# Patient Record
Sex: Female | Born: 1990
Health system: Southern US, Community
[De-identification: ages and names within clinical notes are randomized; demographics above are authoritative.]

## PROBLEM LIST (undated history)

## (undated) DIAGNOSIS — R7303 Prediabetes: Secondary | ICD-10-CM

## (undated) DIAGNOSIS — Z8742 Personal history of other diseases of the female genital tract: Secondary | ICD-10-CM

## (undated) DIAGNOSIS — N760 Acute vaginitis: Secondary | ICD-10-CM

## (undated) DIAGNOSIS — B9689 Other specified bacterial agents as the cause of diseases classified elsewhere: Secondary | ICD-10-CM

## (undated) DIAGNOSIS — E669 Obesity, unspecified: Secondary | ICD-10-CM

## (undated) DIAGNOSIS — A64 Unspecified sexually transmitted disease: Secondary | ICD-10-CM

## (undated) HISTORY — DX: Other specified bacterial agents as the cause of diseases classified elsewhere: B96.89

## (undated) HISTORY — DX: Prediabetes: R73.03

## (undated) HISTORY — DX: Personal history of other diseases of the female genital tract: Z87.42

## (undated) HISTORY — DX: Morbid (severe) obesity due to excess calories: E66.01

## (undated) HISTORY — DX: Unspecified sexually transmitted disease: A64

## (undated) HISTORY — DX: Obesity, unspecified: E66.9

## (undated) HISTORY — DX: Other specified bacterial agents as the cause of diseases classified elsewhere: N76.0

## (undated) HISTORY — PX: WISDOM TOOTH EXTRACTION: SHX21

---

## 2012-03-11 DIAGNOSIS — A64 Unspecified sexually transmitted disease: Secondary | ICD-10-CM

## 2012-03-11 HISTORY — DX: Unspecified sexually transmitted disease: A64

## 2013-05-04 HISTORY — PX: COLPOSCOPY: SHX161

## 2015-07-11 ENCOUNTER — Other Ambulatory Visit: Payer: Self-pay | Admitting: Adult Health

## 2015-07-11 DIAGNOSIS — R1032 Left lower quadrant pain: Secondary | ICD-10-CM

## 2015-07-11 DIAGNOSIS — N926 Irregular menstruation, unspecified: Secondary | ICD-10-CM

## 2015-07-14 ENCOUNTER — Ambulatory Visit: Payer: 59

## 2015-07-21 ENCOUNTER — Ambulatory Visit
Admission: RE | Admit: 2015-07-21 | Discharge: 2015-07-21 | Disposition: A | Discharge status: Home / Self Care | Payer: 59 | Source: Ambulatory Visit | Attending: Adult Health | Admitting: Adult Health

## 2015-07-21 DIAGNOSIS — N926 Irregular menstruation, unspecified: Secondary | ICD-10-CM | POA: Diagnosis not present

## 2015-07-21 DIAGNOSIS — R1032 Left lower quadrant pain: Secondary | ICD-10-CM | POA: Diagnosis not present

## 2015-07-21 DIAGNOSIS — N83202 Unspecified ovarian cyst, left side: Secondary | ICD-10-CM | POA: Insufficient documentation

## 2015-07-29 DIAGNOSIS — Z9189 Other specified personal risk factors, not elsewhere classified: Secondary | ICD-10-CM | POA: Insufficient documentation

## 2016-09-26 IMAGING — US US TRANSVAGINAL NON-OB
1 series · 13 of 25 positions shown · non-contrast
Comparison: None

CLINICAL DATA: 25-year-old female with left lower quadrant
abdominal pain and missed menstrual cycles. LMP 01/31/2016, day 8 of
menstrual cycle.

EXAM:
TRANSABDOMINAL AND TRANSVAGINAL ULTRASOUND OF PELVIS
TECHNIQUE: Both transabdominal and transvaginal ultrasound examinations of the
pelvis were performed. Transabdominal technique was performed for
global imaging of the pelvis including uterus, ovaries, adnexal
regions, and pelvic cul-de-sac. It was necessary to proceed with
endovaginal exam following the transabdominal exam to visualize the
endometrium and adnexa.

[Series 1: us transvaginal non-ob · 0.25mm/px · 13 of 92 slices shown]
[im 1/92]
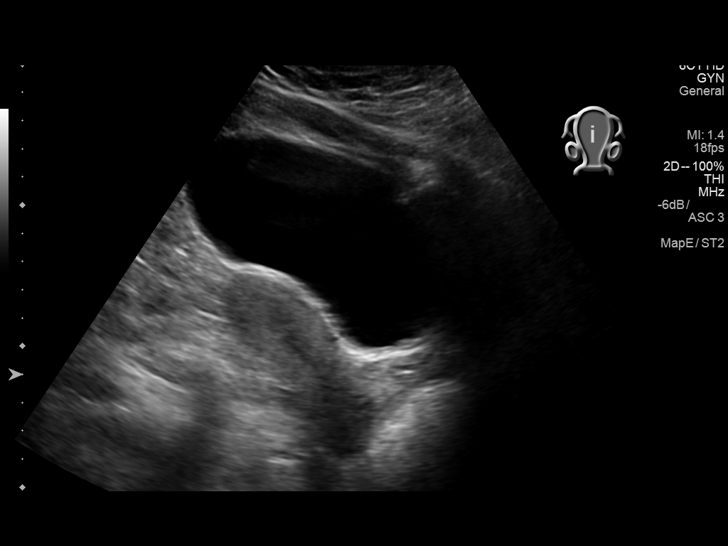
[im 8/92]
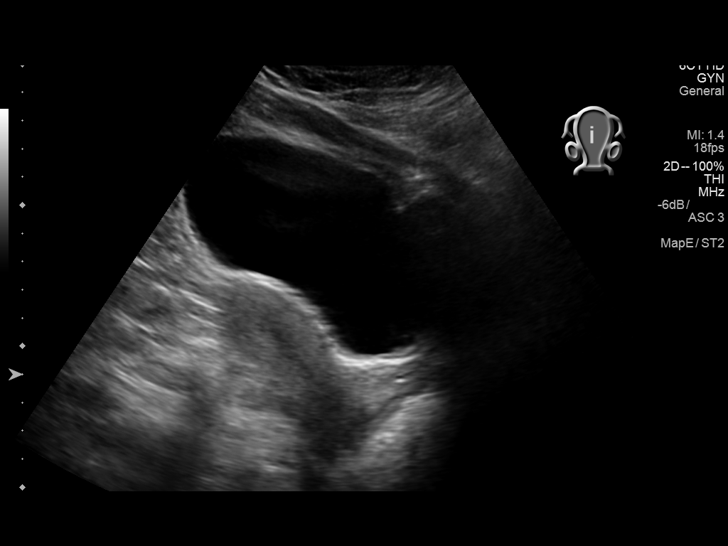
[im 16/92]
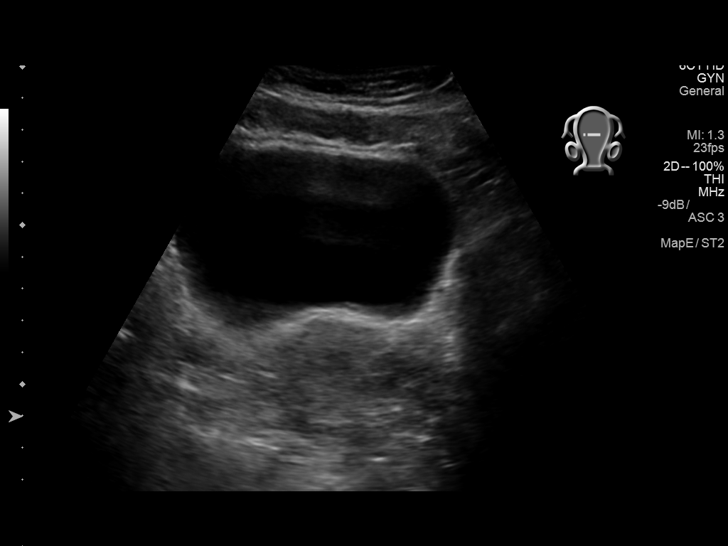
[im 23/92]
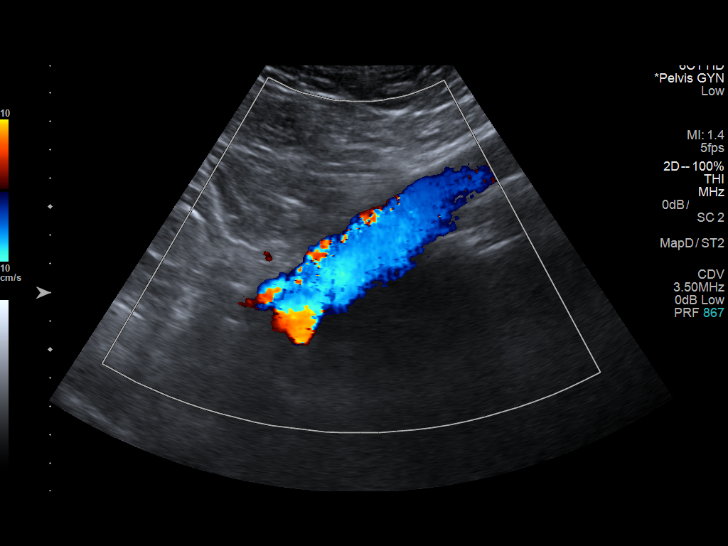
[im 31/92]
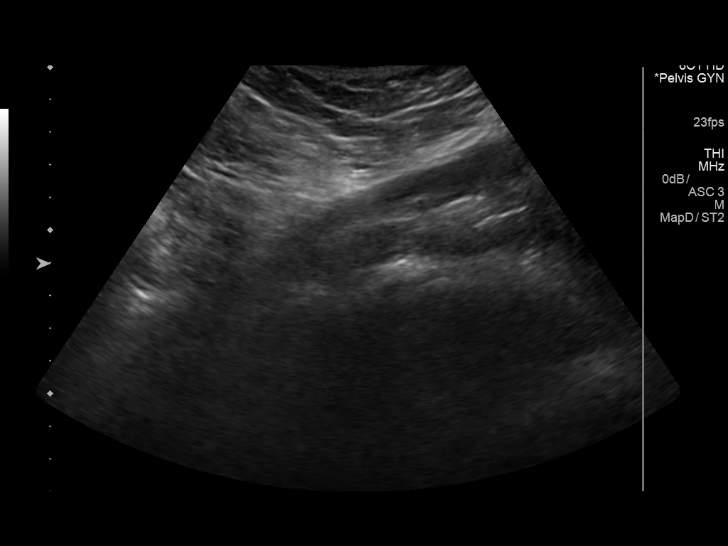
[im 38/92]
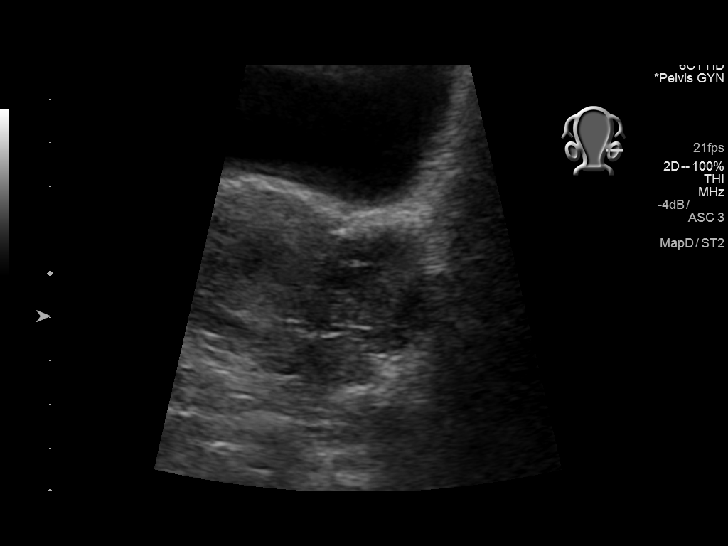
[im 46/92]
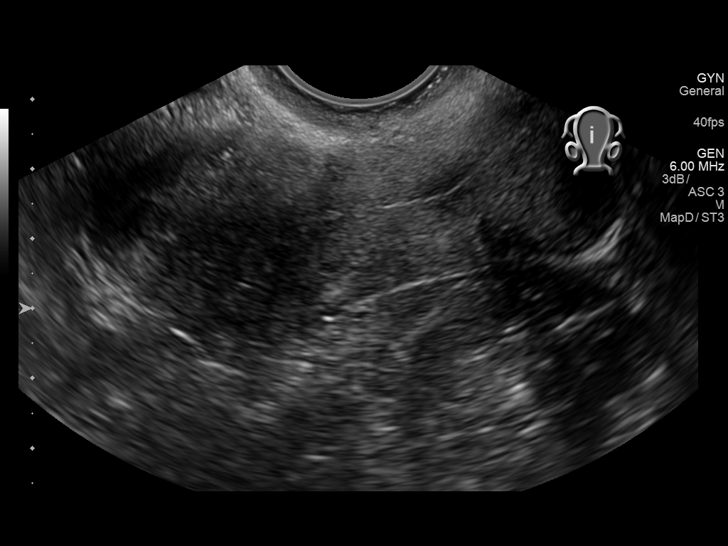
[im 54/92]
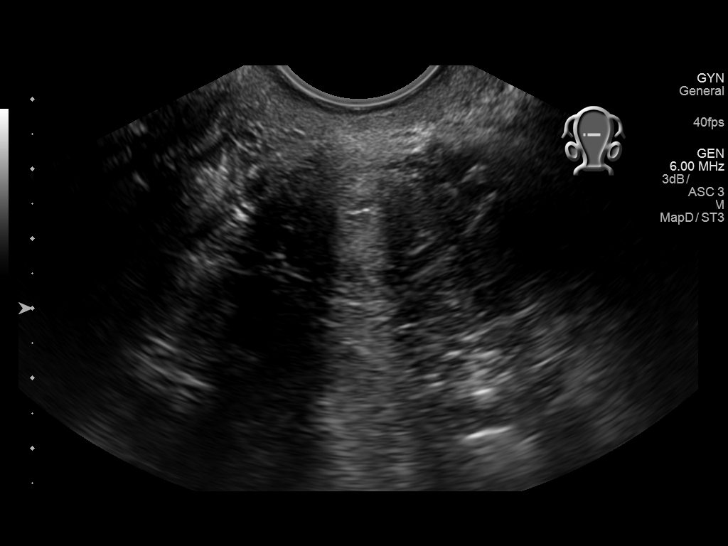
[im 61/92]
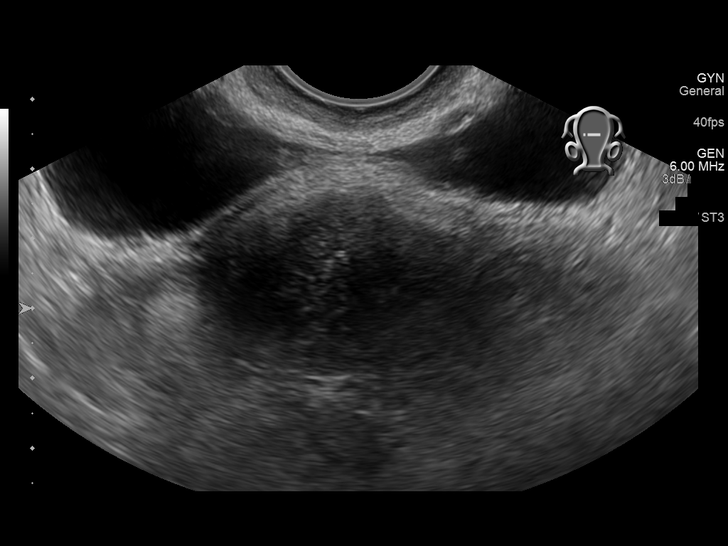
[im 69/92]
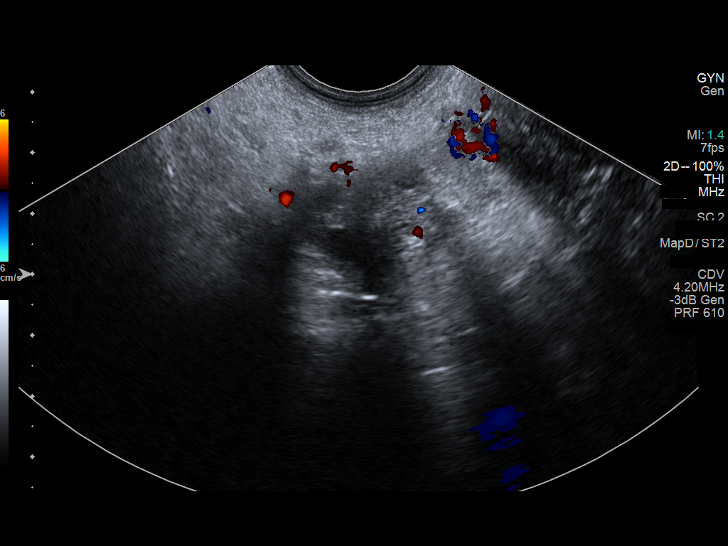
[im 76/92]
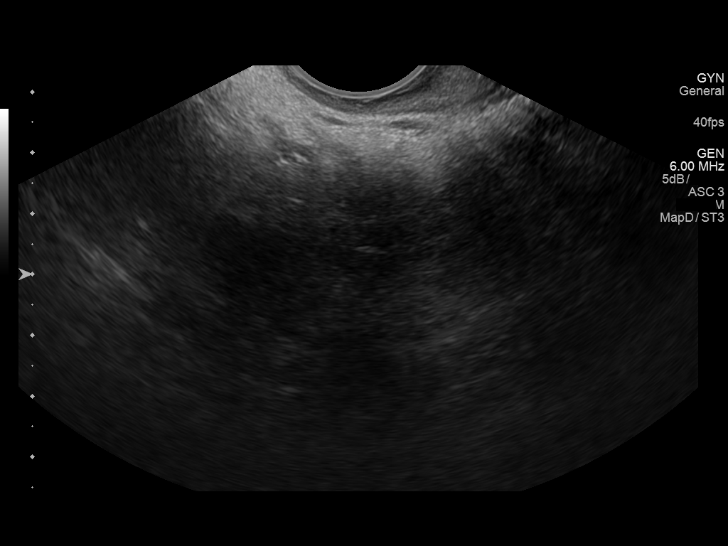
[im 84/92]
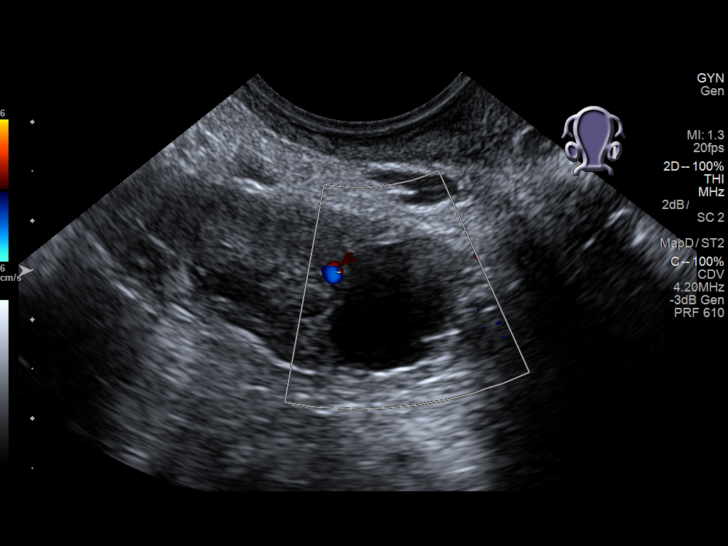
[im 92/92]
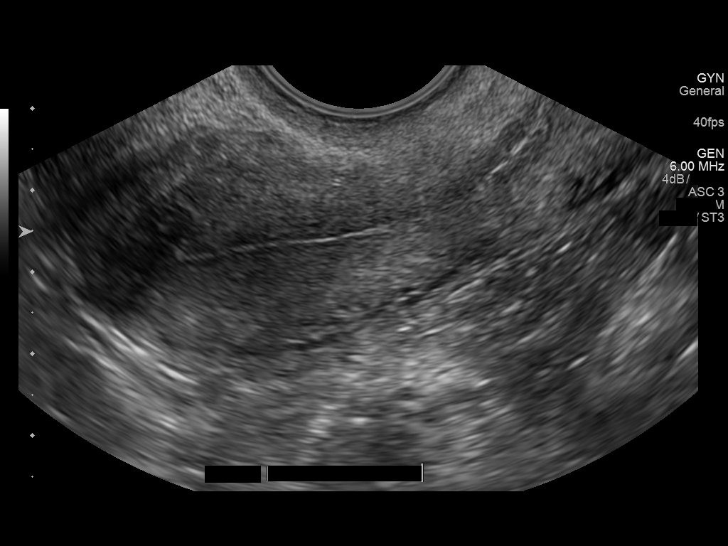

[13 of 25 positions shown; findings below may reference images not displayed]

FINDINGS: Uterus

Measurements: 7.7 x 2.7 x 4.5 cm. The anteverted anteflexed uterus
is normal in size and configuration, with no uterine fibroids or
other myometrial abnormality.

Endometrium

Thickness: 3 mm. No endometrial cavity fluid or focal endometrial
mass.

Right ovary

Measurements: 2.3 x 2.7 x 1.9 cm. Normal appearance/no adnexal mass.

Left ovary

Measurements: 3.3 x 1.8 x 3.0 cm. There is a complex 1.8 x 1.0 x
cm left ovarian cyst with no internal vascularity on color Doppler
and with heterogeneous internal echoes including a thin internal
septation. No additional left adnexal findings.

Other findings

No abnormal free fluid.
IMPRESSION: 1. Complex avascular 1.8 cm left ovarian cyst with heterogeneous
internal echoes, probably a hemorrhagic left ovarian cyst. Recommend
a follow-up transvaginal pelvic ultrasound in 2-3 menstrual cycles
to document resolution given the internal septations.
2. Otherwise normal pelvic sonogram. No uterine fibroids. No
endometrial abnormality.

## 2017-01-31 NOTE — Progress Notes (Signed)
Gynecology Annual Exam  PCP: System, Pcp Not In  Chief Complaint:  Chief Complaint  Patient presents with  . Gynecologic Exam    History of Present Illness:Sara Stevenson is a 26 year old African American/Black female , G 0 P 0 0 0 0 , who presents for her annual exam. She also desires STD screening. Has had a vaginal discharge intermittently since February. Had a check done in February that was negative for vaginitis or STD. Not currently having any itching or bad odor.  She has a hx of abnormal Pap smears. On 03/20/2013 her Pap returned ASCUS with positive HRHPV. A colpo was performed which was negative and a repeat PAP then was NIL with positve HRHPV. Repeat Pap 08/05/13 was ASCUS with positive HRHPV, but on 07/23/2014,  October 2016, and 03/30/2016 she had NIL Paps.  Her menses are usually regular. They occur every  month , they last 3-4 days , are light flow, and are without clots. She did not have a withdrawal bleed in June. She has had no spotting.  She denies dysmenorrhea.  The patient's past medical history is notable for a history of obesity and abnormal Pap smears.  Since her last annual GYN exam dated 03/30/2016 , she had gained then lost almost 20#. She has cut out soft drinks and has been walking 3 days/week. She was successful losing weight in the past with Weight Watchers. She was attending [employer] with some friends from work, but when she got a new job at a different location, she did not continue going to [employer].  She is sexually active. She is currently using birth control pills for contraception.  Her most recent pap smear was obtained 03/30/2016 and was negative.  Mammogram is not applicable.  There is no family history of breast cancer.  There is no family history of ovarian cancer.  The patient does do monthly self breast exams.  The patient does not smoke.  The patient does drink occasionally.  The patient does not use illegal drugs.  The patient does exercise by  walking 3x/week.  The patient does not get adequate calcium in her diet.  She has not had a recent cholesterol screen and is interested in labwork.      The patient denies current symptoms of depression.    Review of Systems: Review of Systems  Constitutional: Negative for chills, fever and weight loss.  HENT: Negative for congestion, sinus pain and sore throat.   Eyes: Negative for blurred vision and pain.  Respiratory: Negative for hemoptysis, shortness of breath and wheezing.   Cardiovascular: Negative for chest pain, palpitations and leg swelling.  Gastrointestinal: Negative for abdominal pain, blood in stool, diarrhea, heartburn, nausea and vomiting.  Genitourinary: Negative for dysuria, frequency, hematuria and urgency.       Positive for vaginal discharge  Musculoskeletal: Negative for back pain, joint pain and myalgias.  Skin: Negative for itching and rash.  Neurological: Negative for dizziness, tingling and headaches.  Endo/Heme/Allergies: Negative for environmental allergies and polydipsia. Does not bruise/bleed easily.       Negative for hirsutism   Psychiatric/Behavioral: Negative for depression. The patient is not nervous/anxious and does not have insomnia.     Past Medical History:  Past Medical History:  Diagnosis Date  . Bacterial vaginitis    recurrrent  . History of abnormal cervical Pap smear   . Morbid obesity (HCC)   . STD (sexually transmitted disease) 03/2012   chlamydia  Past Surgical History:  Past Surgical History:  Procedure Laterality Date  . COLPOSCOPY  05/04/2013   negative  . WISDOM TOOTH EXTRACTION      Family History:  Family History  Problem Relation Age of Onset  . Diabetes Other   . Heart disease Neg Hx   . Hypertension Neg Hx   . Cancer Neg Hx     Social History:  Social History   Social History  . Marital status: Single    Spouse name: N/A  . Number of children: 0  . Years of education: N/A   Occupational History    . Dental Assistant    Social History Main Topics  . Smoking status: Never Smoker  . Smokeless tobacco: Never Used  . Alcohol use Yes     Comment: occasionally  . Drug use: No  . Sexual activity: Yes    Partners: Male    Birth control/ protection: Pill   Other Topics Concern  . Not on file   Social History Narrative  . No narrative on file    Allergies:  No Known Allergies  Medications:  Current Outpatient Prescriptions:  .  LESSINA-28 0.1-20 MG-MCG tablet, , Disp: , Rfl: 0   Physical Exam Vitals: BP 138/68   Pulse 85   Ht 5\' 6"  (1.676 m)   Wt 277 lb (125.6 kg)   LMP 01/07/2017 (Exact Date)   BMI 44.71 kg/m   General: obese BF in NAD HEENT: normocephalic, anicteric Neck: no thyroid enlargement, no palpable nodules, no cervical lymphadenopathy  Pulmonary: No increased work of breathing, CTAB Cardiovascular: RRR, without murmur  Breast: Breast symmetrical, no tenderness, no palpable nodules or masses, no skin or nipple retraction present, no nipple discharge.  No axillary, infraclavicular or supraclavicular lymphadenopathy. Abdomen: Soft, non-tender, obese, non-distended.  Umbilicus without lesions.  No hepatomegaly or masses palpable. No evidence of hernia. Genitourinary:  External: Normal external female genitalia.  Normal urethral meatus, normal Bartholin's and Skene's glands.    Vagina: Normal vaginal mucosa, no evidence of prolapse, moderate white mucoepithelial discharge    Cervix: Grossly normal in appearance, no bleeding, non-tender  Uterus: Anteflexed, normal size, shape, and consistency, mobile, and non-tender  Adnexa: No adnexal masses, non-tender  Rectal: deferred  Lymphatic: no evidence of inguinal lymphadenopathy Extremities: no edema, erythema, or tenderness Neurologic: Grossly intact Psychiatric: mood appropriate, affect full  Results for orders placed or performed in visit on 02/01/17 (from the past 24 hour(s))  POCT Wet Prep [facility] [place])      Status: Normal   Collection Time: 02/03/17  5:59 PM  Result Value Ref Range   Source Wet Prep POC vaginal    WBC, Wet Prep HPF POC     Bacteria Wet Prep HPF POC  Few   BACTERIA WET PREP MORPHOLOGY POC     Clue Cells Wet Prep HPF POC None None   Clue Cells Wet Prep Whiff POC     Yeast Wet Prep HPF POC None    KOH Wet Prep POC     Trichomonas Wet Prep HPF POC Absent Absent      Assessment: 26 y.o. normal gyn exam Obesity  Plan:   1) Breast cancer screening - recommend monthly self breast exam.   2) STI screening done. GC/Chlamydia NAAT, RPR, HIV ordered  3) Cervical cancer screening - Pap was done. ASCCP guidelines and rational discussed.  Patient opts for yearly screening interval  4) Contraception - Refill Aviane x 1 year  5) Routine healthcare maintenance  including cholesterol and diabetes screening ordered today. Discussed weight loss recommendations: reducing calories and increasing exercise: Talked about Belly Fat diet (i.e decreasing CHO in diet), support groups like weight watchers, going to a Bariatric Clinic if desires assistance with medications, etc  6) RTO 1 year and prn  Farrel Conners, CNM

## 2017-02-01 ENCOUNTER — Encounter: Payer: Self-pay | Admitting: Certified Nurse Midwife

## 2017-02-01 ENCOUNTER — Ambulatory Visit (INDEPENDENT_AMBULATORY_CARE_PROVIDER_SITE_OTHER): Payer: 59 | Admitting: Certified Nurse Midwife

## 2017-02-01 VITALS — BP 138/68 | HR 85 | Ht 66.0 in | Wt 277.0 lb

## 2017-02-01 DIAGNOSIS — Z01419 Encounter for gynecological examination (general) (routine) without abnormal findings: Secondary | ICD-10-CM

## 2017-02-01 DIAGNOSIS — Z13220 Encounter for screening for lipoid disorders: Secondary | ICD-10-CM | POA: Diagnosis not present

## 2017-02-01 DIAGNOSIS — N898 Other specified noninflammatory disorders of vagina: Secondary | ICD-10-CM

## 2017-02-01 DIAGNOSIS — Z131 Encounter for screening for diabetes mellitus: Secondary | ICD-10-CM | POA: Diagnosis not present

## 2017-02-01 DIAGNOSIS — Z3041 Encounter for surveillance of contraceptive pills: Secondary | ICD-10-CM

## 2017-02-01 DIAGNOSIS — Z124 Encounter for screening for malignant neoplasm of cervix: Secondary | ICD-10-CM

## 2017-02-01 DIAGNOSIS — Z113 Encounter for screening for infections with a predominantly sexual mode of transmission: Secondary | ICD-10-CM

## 2017-02-02 LAB — LIPID PANEL WITH LDL/HDL RATIO
Cholesterol, Total: 130 mg/dL (ref 100–199)
HDL: 38 mg/dL — ABNORMAL LOW (ref >39)
LDL Calculated: 80 mg/dL (ref 0–99)
LDl/HDL Ratio: 2.1 ratio (ref 0.0–3.2)
Triglycerides: 58 mg/dL (ref 0–149)
VLDL Cholesterol Cal: 12 mg/dL (ref 5–40)

## 2017-02-02 LAB — HGB A1C W/O EAG: HEMOGLOBIN A1C: 5.7 % — AB (ref 4.8–5.6)

## 2017-02-02 LAB — RPR: RPR: NONREACTIVE

## 2017-02-02 LAB — HIV ANTIBODY (ROUTINE TESTING W REFLEX): HIV SCREEN 4TH GENERATION: NONREACTIVE

## 2017-02-03 ENCOUNTER — Encounter: Payer: Self-pay | Admitting: Certified Nurse Midwife

## 2017-02-03 DIAGNOSIS — Z309 Encounter for contraceptive management, unspecified: Secondary | ICD-10-CM | POA: Insufficient documentation

## 2017-02-03 LAB — POCT WET PREP (WET MOUNT): Trichomonas Wet Prep HPF POC: ABSENT

## 2017-02-03 MED ORDER — LESSINA 0.1-20 MG-MCG PO TABS: 1.0000 | ORAL_TABLET | Freq: Every day | ORAL | 11 refills | Status: DC

## 2017-02-05 LAB — PAP IG, CT-NG, RFX HPV ALL
CHLAMYDIA, NUC. ACID AMP: NEGATIVE
GONOCOCCUS BY NUCLEIC ACID AMP: NEGATIVE
PAP Smear Comment: 0

## 2017-02-08 ENCOUNTER — Other Ambulatory Visit: Payer: Self-pay | Admitting: Certified Nurse Midwife

## 2017-02-08 MED ORDER — FLUCONAZOLE 150 MG PO TABS: ORAL_TABLET | ORAL | 0 refills | Status: DC

## 2018-03-01 ENCOUNTER — Other Ambulatory Visit: Payer: Self-pay | Admitting: Certified Nurse Midwife

## 2018-03-13 NOTE — Progress Notes (Signed)
Gynecology Annual Exam  PCP: System, Pcp Not In  Chief Complaint:  Chief Complaint  Patient presents with  . Gynecologic Exam    History of Present Illness:Sara Stevenson is a 27 year old African American/Black female , G 0 P 0 0 0 0 , who presents for her annual exam. She also desires STD screening. She has a hx of abnormal Pap smears. On 03/20/2013 her Pap returned ASCUS with positive HRHPV. A colpo was performed which was negative and a repeat PAP then was NIL with positve HRHPV. Repeat Pap 08/05/13 was ASCUS with positive HRHPV, but on 07/23/2014,  October 2016,  03/30/2016, and 02/01/17 she had NIL Paps.  Her menses are irregular and she will not have a withdrawl bleed some months. They occur every 1-3 months , they last 4 days , are moderate flow with 2 heavier days requiring pad changes 5-6 times/day. She has had no spotting.  She denies dysmenorrhea.  The patient's past medical history is notable for a history of obesity and abnormal Pap smears.  Since her last annual GYN exam dated 02/01/2017 , she has gained #14. Her current BMI is 47.01 kg/m2. She has also recently been treated for uticaria on her shoulders with prednisone, topical steroid and possibly hydroxyzine. She is sexually active. She is currently using birth control pills for contraception.  Her most recent pap smear was obtained 02/01/2017 and was negative.  Mammogram is not applicable.  There is no family history of breast cancer.  There is no family history of ovarian cancer.  The patient does do monthly self breast exams.  The patient does not smoke.  The patient does drink on weekends (2/weekend).  The patient does not use illegal drugs.  The patient does exercise occasionally by playing volleyball.  The patient does not get adequate calcium in her diet.  She has had a recent cholesterol screen in 2018 and it was normal. A hemoglobin A1C was 5.7%.    The patient denies current symptoms of depression.     Review of Systems: Review of Systems  Constitutional: Negative for chills, fever and weight loss.       Possible weight gain  HENT: Negative for congestion, sinus pain and sore throat.   Eyes: Negative for blurred vision and pain.  Respiratory: Negative for hemoptysis, shortness of breath and wheezing.   Cardiovascular: Negative for chest pain, palpitations and leg swelling.  Gastrointestinal: Negative for abdominal pain, blood in stool, diarrhea, heartburn, nausea and vomiting.  Genitourinary: Negative for dysuria, frequency, hematuria and urgency.  Musculoskeletal: Negative for back pain, joint pain and myalgias.  Skin: Positive for itching and rash.  Neurological: Negative for dizziness, tingling and headaches.  Endo/Heme/Allergies: Negative for environmental allergies and polydipsia. Does not bruise/bleed easily.       Negative for hirsutism   Psychiatric/Behavioral: Negative for depression. The patient is not nervous/anxious and does not have insomnia.     Past Medical History:  Past Medical History:  Diagnosis Date  . Bacterial vaginitis    recurrrent  . History of abnormal cervical Pap smear 2010; 03/20/13   POS HRHPV  . Morbid obesity (HCC)   . STD (sexually transmitted disease) 03/2012   chlamydia    Past Surgical History:  Past Surgical History:  Procedure Laterality Date  . COLPOSCOPY  05/04/2013   negative  . WISDOM TOOTH EXTRACTION      Family History:  Family History  Problem Relation Age of Onset  . Diabetes  Other   . Heart disease Neg Hx   . Hypertension Neg Hx   . Cancer Neg Hx     Social History:  Social History   Socioeconomic History  . Marital status: Single    Spouse name: Not on file  . Number of children: 0  . Years of education: 19  . Highest education level: Not on file  Occupational History  . Occupation: Sales executive  . Occupation: CUSTOMER SERVICE    Comment: [employer]  Social Needs  . Financial resource strain: Not on  file  . Food insecurity:    Worry: Not on file    Inability: Not on file  . Transportation needs:    Medical: Not on file    Non-medical: Not on file  Tobacco Use  . Smoking status: Never Smoker  . Smokeless tobacco: Never Used  Substance and Sexual Activity  . Alcohol use: Yes    Comment: occasionally  . Drug use: No  . Sexual activity: Yes    Partners: Male    Birth control/protection: Pill  Lifestyle  . Physical activity:    Days per week: Not on file    Minutes per session: Not on file  . Stress: Not on file  Relationships  . Social connections:    Talks on phone: Not on file    Gets together: Not on file    Attends religious service: Not on file    Active member of club or organization: Not on file    Attends meetings of clubs or organizations: Not on file    Relationship status: Not on file  . Intimate partner violence:    Fear of current or ex partner: Not on file    Emotionally abused: Not on file    Physically abused: Not on file    Forced sexual activity: Not on file  Other Topics Concern  . Not on file  Social History Narrative  . Not on file    Allergies:  No Known Allergies  Medications:  Current Outpatient Medications:  .  LESSINA-28 0.1-20 MG-MCG tablet, TAKE 1 TABLET BY MOUTH ONCE DAILY, Disp: 28 tablet, Rfl: 0   Physical Exam Vitals: BP 120/78   Pulse 78   Ht 5\' 6"  (1.676 m)   Wt 291 lb 4 oz (132.1 kg)   LMP 02/17/2018   BMI 47.01 kg/m   General: obese BF in NAD HEENT: normocephalic, anicteric Neck: no thyroid enlargement, no palpable nodules, no cervical lymphadenopathy  Pulmonary: No increased work of breathing, CTAB Cardiovascular: RRR, without murmur  Breast: Breast symmetrical, no tenderness, no palpable nodules or masses, no skin or nipple retraction present, no nipple discharge.  No axillary, infraclavicular or supraclavicular lymphadenopathy. Abdomen: Soft, non-tender, obese, non-distended.  Umbilicus without lesions.  No  hepatomegaly or masses palpable. No evidence of hernia. Genitourinary:  External: Normal external female genitalia.  Normal urethral meatus, normal Bartholin's and Skene's glands.    Vagina: Normal vaginal mucosa, no evidence of prolapse, moderate white mucoepithelial discharge    Cervix: Grossly normal in appearance, no bleeding, non-tender  Uterus: Anteflexed, normal size, shape, and consistency, mobile, and non-tender  Adnexa: No adnexal masses, non-tender  Rectal: deferred  Lymphatic: no evidence of inguinal lymphadenopathy Extremities: no edema, erythema, or tenderness Neurologic: Grossly intact Psychiatric: mood appropriate, affect full   Assessment: 27 y.o. normal gyn exam Obesity Elevated hemoglobin A1C Plan:   1) Breast cancer screening - recommend monthly self breast exam.   2) STI screening done.  GC/Chlamydia ,  HIV ordered  3) Cervical cancer screening - Pap was done. ASCCP guidelines and rational discussed.  Patient opts for yearly screening interval  4) Contraception - Refill Aviane x 1 year  5) Hemoglobin A1C repeated today.  6) RTO 1 year and prn  Farrel Conners, CNM

## 2018-03-14 ENCOUNTER — Encounter: Payer: Self-pay | Admitting: Certified Nurse Midwife

## 2018-03-14 ENCOUNTER — Ambulatory Visit (INDEPENDENT_AMBULATORY_CARE_PROVIDER_SITE_OTHER): Payer: BLUE CROSS/BLUE SHIELD | Admitting: Certified Nurse Midwife

## 2018-03-14 ENCOUNTER — Other Ambulatory Visit (HOSPITAL_COMMUNITY)
Admission: RE | Admit: 2018-03-14 | Discharge: 2018-03-14 | Disposition: A | Discharge status: Home / Self Care | Payer: BLUE CROSS/BLUE SHIELD | Source: Ambulatory Visit | Attending: Certified Nurse Midwife | Admitting: Certified Nurse Midwife

## 2018-03-14 VITALS — BP 120/78 | HR 78 | Ht 66.0 in | Wt 291.2 lb

## 2018-03-14 DIAGNOSIS — Z124 Encounter for screening for malignant neoplasm of cervix: Secondary | ICD-10-CM | POA: Insufficient documentation

## 2018-03-14 DIAGNOSIS — Z113 Encounter for screening for infections with a predominantly sexual mode of transmission: Secondary | ICD-10-CM | POA: Insufficient documentation

## 2018-03-14 DIAGNOSIS — R7309 Other abnormal glucose: Secondary | ICD-10-CM

## 2018-03-14 DIAGNOSIS — Z01419 Encounter for gynecological examination (general) (routine) without abnormal findings: Secondary | ICD-10-CM | POA: Diagnosis not present

## 2018-03-15 LAB — HEMOGLOBIN A1C
ESTIMATED AVERAGE GLUCOSE: 123 mg/dL
HEMOGLOBIN A1C: 5.9 % — AB (ref 4.8–5.6)

## 2018-03-15 LAB — HIV ANTIBODY (ROUTINE TESTING W REFLEX): HIV Screen 4th Generation wRfx: NONREACTIVE

## 2018-03-16 MED ORDER — LESSINA 0.1-20 MG-MCG PO TABS: 1.0000 | ORAL_TABLET | Freq: Every day | ORAL | 3 refills | Status: DC

## 2018-03-17 LAB — CYTOLOGY - PAP
CHLAMYDIA, DNA PROBE: NEGATIVE
DIAGNOSIS: NEGATIVE
Neisseria Gonorrhea: NEGATIVE

## 2018-03-29 ENCOUNTER — Other Ambulatory Visit: Payer: Self-pay | Admitting: Certified Nurse Midwife

## 2018-08-08 DIAGNOSIS — N760 Acute vaginitis: Secondary | ICD-10-CM | POA: Diagnosis not present

## 2018-08-18 DIAGNOSIS — H5213 Myopia, bilateral: Secondary | ICD-10-CM | POA: Diagnosis not present

## 2018-10-29 DIAGNOSIS — Z1159 Encounter for screening for other viral diseases: Secondary | ICD-10-CM | POA: Diagnosis not present

## 2018-10-29 DIAGNOSIS — L509 Urticaria, unspecified: Secondary | ICD-10-CM | POA: Diagnosis not present

## 2018-11-27 DIAGNOSIS — Z8742 Personal history of other diseases of the female genital tract: Secondary | ICD-10-CM | POA: Diagnosis not present

## 2018-11-27 DIAGNOSIS — N926 Irregular menstruation, unspecified: Secondary | ICD-10-CM | POA: Diagnosis not present

## 2018-11-27 DIAGNOSIS — R1032 Left lower quadrant pain: Secondary | ICD-10-CM | POA: Diagnosis not present

## 2018-11-27 DIAGNOSIS — R1031 Right lower quadrant pain: Secondary | ICD-10-CM | POA: Diagnosis not present

## 2018-12-08 ENCOUNTER — Other Ambulatory Visit: Payer: Self-pay

## 2018-12-08 ENCOUNTER — Ambulatory Visit (INDEPENDENT_AMBULATORY_CARE_PROVIDER_SITE_OTHER): Payer: BC Managed Care – PPO | Admitting: Obstetrics and Gynecology

## 2018-12-08 ENCOUNTER — Encounter: Payer: Self-pay | Admitting: Obstetrics and Gynecology

## 2018-12-08 VITALS — BP 138/80 | HR 83 | Ht 66.0 in | Wt 299.0 lb

## 2018-12-08 DIAGNOSIS — R350 Frequency of micturition: Secondary | ICD-10-CM | POA: Diagnosis not present

## 2018-12-08 DIAGNOSIS — R102 Pelvic and perineal pain: Secondary | ICD-10-CM | POA: Diagnosis not present

## 2018-12-08 DIAGNOSIS — Z113 Encounter for screening for infections with a predominantly sexual mode of transmission: Secondary | ICD-10-CM | POA: Diagnosis not present

## 2018-12-08 DIAGNOSIS — N926 Irregular menstruation, unspecified: Secondary | ICD-10-CM

## 2018-12-08 DIAGNOSIS — N939 Abnormal uterine and vaginal bleeding, unspecified: Secondary | ICD-10-CM

## 2018-12-08 LAB — POCT URINALYSIS DIPSTICK
Bilirubin, UA: NEGATIVE
Blood, UA: NEGATIVE
Glucose, UA: NEGATIVE
Nitrite, UA: NEGATIVE
Protein, UA: POSITIVE — AB
Spec Grav, UA: 1.025 (ref 1.010–1.025)
Urobilinogen, UA: 0.2 E.U./dL
pH, UA: 5 (ref 5.0–8.0)

## 2018-12-08 NOTE — Progress Notes (Signed)
Patient ID: Sara Stevenson Rawson, female   DOB: Nov 08, 1990, 28 y.o.   MRN: [id number]  Reason for Consult: Pelvic Pain (Heavy periods, 2 weeks of pelvic pain that felt constant)   Referred by No ref. provider found  Subjective:     HPI:  Sara Stevenson Klonowski is a 28 y.o. female. She is having issues with heavy bleeding during her menstrual cycle and constant pelvic pain  Past Medical History:  Diagnosis Date  . Bacterial vaginitis    recurrrent  . History of abnormal cervical Pap smear 2010; 03/20/13   POS HRHPV  . Morbid obesity (HCC)   . STD (sexually transmitted disease) 03/2012   chlamydia   Family History  Problem Relation Age of Onset  . Diabetes Other   . Heart disease Neg Hx   . Hypertension Neg Hx   . Cancer Neg Hx    Past Surgical History:  Procedure Laterality Date  . COLPOSCOPY  05/04/2013   negative  . WISDOM TOOTH EXTRACTION      Short Social History:  Social History   Tobacco Use  . Smoking status: Never Smoker  . Smokeless tobacco: Never Used  Substance Use Topics  . Alcohol use: Yes    Comment: occasionally    No Known Allergies  Current Outpatient Medications  Medication Sig Dispense Refill  . cetirizine (ZYRTEC) 10 MG tablet Take by mouth.    . LESSINA-28 0.1-20 MG-MCG tablet Take 1 tablet by mouth daily. 84 tablet 3   No current facility-administered medications for this visit.     Review of Systems  Constitutional: Negative for chills, fatigue, fever and unexpected weight change.  HENT: Negative for trouble swallowing.  Eyes: Negative for loss of vision.  Respiratory: Negative for cough, shortness of breath and wheezing.  Cardiovascular: Negative for chest pain, leg swelling, palpitations and syncope.  GI: Gastrointestinal negative. Negative for abdominal pain, blood in stool, diarrhea, nausea and vomiting.  GU: Positive for frequency. Negative for difficulty urinating, dysuria and hematuria.  Musculoskeletal: Negative for back pain, leg pain  and joint pain.  Skin: Negative for rash.  Neurological: Negative for dizziness, headaches, light-headedness, numbness and seizures.  Psychiatric: Negative for behavioral problem, confusion, depressed mood and sleep disturbance.        Objective:  Objective   Vitals:   12/08/18 0811  BP: 138/80  Pulse: 83  Weight: 299 lb (135.6 kg)  Height: 5\' 6"  (1.676 m)   Body mass index is 48.26 kg/m.  Physical Exam Vitals signs and nursing note reviewed.  Constitutional:      Appearance: She is well-developed.  HENT:     Head: Normocephalic and atraumatic.  Eyes:     Pupils: Pupils are equal, round, and reactive to light.  Cardiovascular:     Rate and Rhythm: Normal rate and regular rhythm.  Pulmonary:     Effort: Pulmonary effort is normal. No respiratory distress.  Genitourinary:    Comments: Normal cervix. Scant normal discharge. No vulvar or vaginal lesions. No CMT. No uterine or adnexal tenderness. No ovarian masses appreciated. Exam limited by body habitus but otherwise normal uterine shape and size.  Skin:    General: Skin is warm and dry.  Neurological:     Mental Status: She is alert and oriented to person, place, and time.  Psychiatric:        Behavior: Behavior normal.        Thought Content: Thought content normal.        Judgment: Judgment normal.  10/29/2018 TSH 0.93 CBC hgb 11.6      Assessment/Plan:     Follow up for [place]- possible fibroids with heavy bleeding STD testing at patient request Urine culture sent   Gordon, [place] 12/08/2018 8:22 AM

## 2018-12-10 LAB — URINE CULTURE: Organism ID, Bacteria: NO GROWTH

## 2018-12-11 ENCOUNTER — Telehealth: Payer: Self-pay

## 2018-12-11 LAB — NUSWAB VAGINITIS PLUS (VG+)
Candida albicans, NAA: NEGATIVE
Candida glabrata, NAA: NEGATIVE
Chlamydia trachomatis, NAA: NEGATIVE
Neisseria gonorrhoeae, NAA: NEGATIVE
Trich vag by NAA: NEGATIVE

## 2018-12-11 NOTE — Telephone Encounter (Signed)
Pt calling for results from Monday.  [phone]  Left detailed msg; apologized no one has given her the results;  Adv all negative.

## 2018-12-11 NOTE — Progress Notes (Signed)
WNL, released to mychart.

## 2018-12-19 ENCOUNTER — Other Ambulatory Visit: Payer: Self-pay

## 2018-12-19 ENCOUNTER — Ambulatory Visit (INDEPENDENT_AMBULATORY_CARE_PROVIDER_SITE_OTHER): Payer: BC Managed Care – PPO

## 2018-12-19 ENCOUNTER — Ambulatory Visit (INDEPENDENT_AMBULATORY_CARE_PROVIDER_SITE_OTHER): Payer: BC Managed Care – PPO | Admitting: Obstetrics and Gynecology

## 2018-12-19 ENCOUNTER — Encounter: Payer: Self-pay | Admitting: Obstetrics and Gynecology

## 2018-12-19 VITALS — Ht 66.0 in | Wt 302.0 lb

## 2018-12-19 DIAGNOSIS — Z3009 Encounter for other general counseling and advice on contraception: Secondary | ICD-10-CM | POA: Diagnosis not present

## 2018-12-19 DIAGNOSIS — R102 Pelvic and perineal pain unspecified side: Secondary | ICD-10-CM

## 2018-12-19 DIAGNOSIS — N926 Irregular menstruation, unspecified: Secondary | ICD-10-CM

## 2018-12-19 DIAGNOSIS — N911 Secondary amenorrhea: Secondary | ICD-10-CM

## 2018-12-19 DIAGNOSIS — N939 Abnormal uterine and vaginal bleeding, unspecified: Secondary | ICD-10-CM

## 2018-12-19 NOTE — Progress Notes (Signed)
Virtual Visit via Telephone Note  I connected with Sara Stevenson on 12/22/18 at  2:10 PM EDT by telephone and verified that I am speaking with the correct person using two identifiers.   I discussed the limitations, risks, security and privacy concerns of performing an evaluation and management service by telephone and the availability of in person appointments. I also discussed with the patient that there may be a patient responsible charge related to this service. The patient expressed understanding and agreed to proceed.  The patient was at home I spoke with the patient from my office The names of people involved in this encounter were: [name] and [place].   History of Present Illness: Patient presents today for [place] follow up. She is on an OCP but having very light bleeding.    Observations/Objective:   Physical Exam could not be performed. Because of the COVID-19 outbreak this visit was performed over the phone and not in person.   Assessment and Plan: 28 yo with light vaginal bleeding on her period.  Thin endometrium on [place] today. Will try a higher estrogen OCP and see if this results in a normal period bleeding.   Follow Up Instructions: Follow up in 1-2 months. Telephone visit okay   I discussed the assessment and treatment plan with the patient. The patient was provided an opportunity to ask questions and all were answered. The patient agreed with the plan and demonstrated an understanding of the instructions.   The patient was advised to call back or seek an in-person evaluation if the symptoms worsen or if the condition fails to improve as anticipated.  I provided 8 minutes of non-face-to-face time during this encounter.  Adrian Prows MD [facility], [facility] 12/22/2018 3:50 PM

## 2018-12-22 ENCOUNTER — Telehealth: Payer: Self-pay

## 2018-12-22 ENCOUNTER — Encounter: Payer: Self-pay | Admitting: Obstetrics and Gynecology

## 2018-12-22 MED ORDER — DESOGESTREL-ETHINYL ESTRADIOL 0.15-30 MG-MCG PO TABS: 1.0000 | ORAL_TABLET | Freq: Every day | ORAL | 11 refills | Status: DC

## 2018-12-22 NOTE — Telephone Encounter (Signed)
Pt calling; [facility] was supposed to send in rx for new bc; hasn't been sent to pharm; supposed to start a new pack today.  [phone]

## 2018-12-22 NOTE — Telephone Encounter (Signed)
Called [facility] and she is sending in the Rx right now. Thank you

## 2018-12-23 ENCOUNTER — Telehealth: Payer: Self-pay | Admitting: Obstetrics and Gynecology

## 2018-12-23 NOTE — Telephone Encounter (Signed)
-----   Message from Homero Fellers, MD sent at 12/22/2018  4:11 PM EDT ----- Could you please call and schedule this patient for a follow up telephone visit in 4-8 weeks? Thank you,  Dr. Gilman Schmidt

## 2018-12-23 NOTE — Telephone Encounter (Signed)
Called and left detailed message for patient to call back and confirm schedule telephone appointment for Tuesday, 01/20/19 at 8:50 with Dr. Gilman Schmidt .

## 2018-12-24 NOTE — Telephone Encounter (Signed)
Called and left voice mail for patient to call back to be schedule °

## 2018-12-26 ENCOUNTER — Other Ambulatory Visit: Payer: BC Managed Care – PPO

## 2018-12-26 ENCOUNTER — Other Ambulatory Visit: Payer: Self-pay

## 2018-12-26 ENCOUNTER — Other Ambulatory Visit: Payer: Self-pay | Admitting: Obstetrics and Gynecology

## 2018-12-26 DIAGNOSIS — N911 Secondary amenorrhea: Secondary | ICD-10-CM

## 2018-12-26 NOTE — Telephone Encounter (Signed)
Called and left voice mail for patient to call back to be schedule °

## 2018-12-30 LAB — LUTEINIZING HORMONE: LH: 12.8 m[IU]/mL

## 2018-12-30 LAB — PROLACTIN: Prolactin: 17.6 ng/mL (ref 4.8–23.3)

## 2018-12-30 LAB — TESTOSTERONE,FREE AND TOTAL
Testosterone, Free: 1.3 pg/mL (ref 0.0–4.2)
Testosterone: 17 ng/dL (ref 8–48)

## 2018-12-30 LAB — FOLLICLE STIMULATING HORMONE: FSH: 6.2 m[IU]/mL

## 2018-12-30 LAB — ESTRADIOL: Estradiol: 98.2 pg/mL

## 2018-12-30 NOTE — Progress Notes (Signed)
WNL, LH: FSH ratio elevated, released to mychart with note.

## 2019-01-20 ENCOUNTER — Ambulatory Visit: Payer: BC Managed Care – PPO | Admitting: Obstetrics and Gynecology

## 2019-02-20 ENCOUNTER — Other Ambulatory Visit: Payer: Self-pay | Admitting: Certified Nurse Midwife

## 2019-03-16 ENCOUNTER — Ambulatory Visit: Payer: BC Managed Care – PPO | Admitting: Obstetrics and Gynecology

## 2019-03-27 ENCOUNTER — Other Ambulatory Visit: Payer: Self-pay

## 2019-03-27 ENCOUNTER — Encounter: Payer: Self-pay | Admitting: Obstetrics and Gynecology

## 2019-03-27 ENCOUNTER — Ambulatory Visit (INDEPENDENT_AMBULATORY_CARE_PROVIDER_SITE_OTHER): Payer: BC Managed Care – PPO | Admitting: Obstetrics and Gynecology

## 2019-03-27 VITALS — BP 130/76 | HR 75 | Ht 66.0 in | Wt 285.0 lb

## 2019-03-27 DIAGNOSIS — N939 Abnormal uterine and vaginal bleeding, unspecified: Secondary | ICD-10-CM | POA: Diagnosis not present

## 2019-03-27 DIAGNOSIS — Z113 Encounter for screening for infections with a predominantly sexual mode of transmission: Secondary | ICD-10-CM

## 2019-03-27 DIAGNOSIS — Z131 Encounter for screening for diabetes mellitus: Secondary | ICD-10-CM

## 2019-03-27 DIAGNOSIS — Z01419 Encounter for gynecological examination (general) (routine) without abnormal findings: Secondary | ICD-10-CM | POA: Diagnosis not present

## 2019-03-27 DIAGNOSIS — Z13220 Encounter for screening for lipoid disorders: Secondary | ICD-10-CM

## 2019-03-27 DIAGNOSIS — Z1239 Encounter for other screening for malignant neoplasm of breast: Secondary | ICD-10-CM

## 2019-03-27 DIAGNOSIS — Z1289 Encounter for screening for malignant neoplasm of other sites: Secondary | ICD-10-CM

## 2019-03-27 DIAGNOSIS — Z6841 Body Mass Index (BMI) 40.0 and over, adult: Secondary | ICD-10-CM

## 2019-03-27 DIAGNOSIS — Z0000 Encounter for general adult medical examination without abnormal findings: Secondary | ICD-10-CM | POA: Diagnosis not present

## 2019-03-27 DIAGNOSIS — Z1329 Encounter for screening for other suspected endocrine disorder: Secondary | ICD-10-CM

## 2019-03-27 NOTE — Patient Instructions (Signed)
Institute of Medicine Recommended Dietary Allowances for Calcium and Vitamin D  Age (yr) Calcium Recommended Dietary Allowance (mg/day) Vitamin D Recommended Dietary Allowance (international units/day)  9-18 1,300 600  19-50 1,000 600  51-70 1,200 600  71 and older 1,200 800  Data from Institute of Medicine. Dietary reference intakes: calcium, vitamin D. [place], [place]: National Academies Press; 2011.    

## 2019-03-27 NOTE — Progress Notes (Signed)
Gynecology Annual Exam   PCP: System, Pcp Not In  Chief Complaint:  Chief Complaint  Patient presents with  . Gynecologic Exam    Birth control has made periods regular    History of Present Illness: Patient is a 28 y.o. G0P0000 presents for annual exam. The patient has no complaints today.   LMP: Patient's last menstrual period was 03/14/2019 (exact date). Average Interval: regular, 28 days Duration of flow: 3 days Heavy Menses: no Clots: no Intermenstrual Bleeding: no Postcoital Bleeding: no Dysmenorrhea: no  The patient is sexually active. She currently uses OCP (estrogen/progesterone) for contraception. She denies dyspareunia.  The patient does perform self breast exams.  There is no notable family history of breast or ovarian cancer in her family.  The patient wears seatbelts: yes.   The patient has regular exercise: walking.    The patient denies current symptoms of depression.    Review of Systems: Review of Systems  Constitutional: Negative for chills, fever, malaise/fatigue and weight loss.  HENT: Negative for congestion, hearing loss and sinus pain.   Eyes: Negative for blurred vision and double vision.  Respiratory: Negative for cough, sputum production, shortness of breath and wheezing.   Cardiovascular: Negative for chest pain, palpitations, orthopnea and leg swelling.  Gastrointestinal: Negative for abdominal pain, constipation, diarrhea, nausea and vomiting.  Genitourinary: Negative for dysuria, flank pain, frequency, hematuria and urgency.  Musculoskeletal: Negative for back pain, falls and joint pain.  Skin: Negative for itching and rash.  Neurological: Negative for dizziness and headaches.  Psychiatric/Behavioral: Negative for depression, substance abuse and suicidal ideas. The patient is not nervous/anxious.     Past Medical History:  Past Medical History:  Diagnosis Date  . Bacterial vaginitis    recurrrent  . History of abnormal cervical Pap  smear 2010; 03/20/13   POS HRHPV  . Morbid obesity ([place])   . STD (sexually transmitted disease) 03/2012   chlamydia    Past Surgical History:  Past Surgical History:  Procedure Laterality Date  . COLPOSCOPY  05/04/2013   negative  . WISDOM TOOTH EXTRACTION      Gynecologic History:  Patient's last menstrual period was 03/14/2019 (exact date). Contraception: OCP (estrogen/progesterone) Last Pap: Results were: NIL 2019   Obstetric History: G0P0000  Family History:  Family History  Problem Relation Age of Onset  . Diabetes Other   . Heart disease Neg Hx   . Hypertension Neg Hx   . Cancer Neg Hx     Social History:  Social History   Socioeconomic History  . Marital status: Single    Spouse name: Not on file  . Number of children: 0  . Years of education: 46  . Highest education level: Not on file  Occupational History  . Occupation: Art therapist  . Occupation: CUSTOMER SERVICE    Comment: [employer]  Social Needs  . Financial resource strain: Not on file  . Food insecurity    Worry: Not on file    Inability: Not on file  . Transportation needs    Medical: Not on file    Non-medical: Not on file  Tobacco Use  . Smoking status: Never Smoker  . Smokeless tobacco: Never Used  Substance and Sexual Activity  . Alcohol use: Yes    Comment: occasionally  . Drug use: No  . Sexual activity: Yes    Partners: Male    Birth control/protection: Pill  Lifestyle  . Physical activity    Days per week:  Not on file    Minutes per session: Not on file  . Stress: Not on file  Relationships  . Social Musicianconnections    Talks on phone: Not on file    Gets together: Not on file    Attends religious service: Not on file    Active member of club or organization: Not on file    Attends meetings of clubs or organizations: Not on file    Relationship status: Not on file  . Intimate partner violence    Fear of current or ex partner: Not on file    Emotionally abused: Not on  file    Physically abused: Not on file    Forced sexual activity: Not on file  Other Topics Concern  . Not on file  Social History Narrative  . Not on file    Allergies:  No Known Allergies  Medications: Prior to Admission medications   Medication Sig Start Date End Date Taking? Authorizing Provider  cetirizine (ZYRTEC) 10 MG tablet Take by mouth. 10/29/18 10/29/19 Yes [provider]  desogestrel-ethinyl estradiol (APRI) 0.15-30 MG-MCG tablet Take 1 tablet by mouth daily. 12/22/18  Yes Schuman, Jaquelyn Bitterhristanna R, MD    Physical Exam Vitals: Blood pressure 130/76, pulse 75, height 5\' 6"  (1.676 m), weight 285 lb (129.3 kg), last menstrual period 03/14/2019.  General: NAD HEENT: normocephalic, anicteric Thyroid: no enlargement, no palpable nodules Pulmonary: No increased work of breathing, CTAB Cardiovascular: RRR, distal pulses 2+ Breast: Breast symmetrical, no tenderness, no palpable nodules or masses, no skin or nipple retraction present, no nipple discharge.  No axillary or supraclavicular lymphadenopathy. Abdomen: NABS, soft, non-tender, non-distended.  Umbilicus without lesions.  No hepatomegaly, splenomegaly or masses palpable. No evidence of hernia  Genitourinary:  External: Normal external female genitalia.  Normal urethral meatus, normal Bartholin's and Skene's glands.    Vagina: Normal vaginal mucosa, no evidence of prolapse.    Cervix: Grossly normal in appearance, no bleeding  Uterus: Non-enlarged, mobile, normal contour.  No CMT  Adnexa: ovaries non-enlarged, no adnexal masses  Rectal: deferred  Lymphatic: no evidence of inguinal lymphadenopathy Extremities: no edema, erythema, or tenderness Neurologic: Grossly intact Psychiatric: mood appropriate, affect full  Female chaperone present for pelvic and breast  portions of the physical exam    Assessment: 28 y.o. G0P0000 routine annual exam  Plan: Problem List Items Addressed This Visit    None    Visit  Diagnoses    Healthcare maintenance    -  Primary   Relevant Orders   Lipid panel   Hemoglobin A1c   CBC With Differential   HIV antibody (with reflex)   TSH + free T4   Basic Metabolic Panel (BMET)   RPR   Hepatitis panel, acute   NuSwab Vaginitis Plus (VG+)   Encounter for pelvic screening for cancer       Screening breast examination       Screening examination for STD (sexually transmitted disease)       Relevant Orders   HIV antibody (with reflex)   RPR   Hepatitis panel, acute   Screening for diabetes mellitus       Relevant Orders   Hemoglobin A1c   Screening cholesterol level       Relevant Orders   Lipid panel   Screening for thyroid disorder       Relevant Orders   TSH + free T4   BMI 45.0-49.9, adult (HCC)          2)  STI screening  was offered and accepted  2)  ASCCP guidelines and rational discussed.  Patient opts for every 3 years screening interval  3) Contraception - the patient is currently using  OCP (estrogen/progesterone).  She is happy with her current form of contraception and plans to continue  4) Routine healthcare maintenance including cholesterol, diabetes screening discussed Ordered today  5) Patient has successfully lost weight since prior visit. She is also eating vegetables now which is new for her. She enjoys green beans and spinach.   6) Return in about 1 year (around 03/26/2020) for annual.  Adelene Idler MD [facility], [facility] 03/27/2019 9:46 AM

## 2019-03-28 LAB — CBC WITH DIFFERENTIAL
Basophils Absolute: 0 10*3/uL (ref 0.0–0.2)
Basos: 0 %
EOS (ABSOLUTE): 0.1 10*3/uL (ref 0.0–0.4)
Eos: 1 %
Hematocrit: 38.8 % (ref 34.0–46.6)
Hemoglobin: 12 g/dL (ref 11.1–15.9)
Immature Grans (Abs): 0 10*3/uL (ref 0.0–0.1)
Immature Granulocytes: 0 %
Lymphocytes Absolute: 2.7 10*3/uL (ref 0.7–3.1)
Lymphs: 34 %
MCH: 25.5 pg — ABNORMAL LOW (ref 26.6–33.0)
MCHC: 30.9 g/dL — ABNORMAL LOW (ref 31.5–35.7)
MCV: 82 fL (ref 79–97)
Monocytes Absolute: 0.5 10*3/uL (ref 0.1–0.9)
Monocytes: 6 %
Neutrophils Absolute: 4.5 10*3/uL (ref 1.4–7.0)
Neutrophils: 59 %
RBC: 4.71 x10E6/uL (ref 3.77–5.28)
RDW: 13.8 % (ref 11.7–15.4)
WBC: 7.7 10*3/uL (ref 3.4–10.8)

## 2019-03-28 LAB — BASIC METABOLIC PANEL
BUN/Creatinine Ratio: 8 — ABNORMAL LOW (ref 9–23)
BUN: 8 mg/dL (ref 6–20)
CO2: 20 mmol/L (ref 20–29)
Calcium: 9.1 mg/dL (ref 8.7–10.2)
Chloride: 105 mmol/L (ref 96–106)
Creatinine, Ser: 0.97 mg/dL (ref 0.57–1.00)
GFR calc Af Amer: 91 mL/min/{1.73_m2} (ref >59)
GFR calc non Af Amer: 79 mL/min/{1.73_m2} (ref >59)
Glucose: 89 mg/dL (ref 65–99)
Potassium: 4.2 mmol/L (ref 3.5–5.2)
Sodium: 139 mmol/L (ref 134–144)

## 2019-03-28 LAB — TSH+FREE T4
Free T4: 1.15 ng/dL (ref 0.82–1.77)
TSH: 2.16 u[IU]/mL (ref 0.450–4.500)

## 2019-03-28 LAB — HIV ANTIBODY (ROUTINE TESTING W REFLEX): HIV Screen 4th Generation wRfx: NONREACTIVE

## 2019-03-28 LAB — HEMOGLOBIN A1C
Est. average glucose Bld gHb Est-mCnc: 128 mg/dL
Hgb A1c MFr Bld: 6.1 % — ABNORMAL HIGH (ref 4.8–5.6)

## 2019-03-28 LAB — HEPATITIS PANEL, ACUTE
Hep A IgM: NEGATIVE
Hep B C IgM: NEGATIVE
Hep C Virus Ab: <0.1 s/co ratio (ref 0.0–0.9)
Hepatitis B Surface Ag: NEGATIVE

## 2019-03-28 LAB — LIPID PANEL
Chol/HDL Ratio: 3.1 ratio (ref 0.0–4.4)
Cholesterol, Total: 124 mg/dL (ref 100–199)
HDL: 40 mg/dL (ref >39)
LDL Chol Calc (NIH): 65 mg/dL (ref 0–99)
Triglycerides: 100 mg/dL (ref 0–149)
VLDL Cholesterol Cal: 19 mg/dL (ref 5–40)

## 2019-03-28 LAB — RPR: RPR Ser Ql: NONREACTIVE

## 2019-03-30 ENCOUNTER — Telehealth: Payer: Self-pay

## 2019-03-30 NOTE — Telephone Encounter (Signed)
Patient inquiring about 03/27/2019 lab results which haven't shown up in my chart yet. [phone]

## 2019-03-30 NOTE — Progress Notes (Signed)
Pre-diabetes, note released to mychart. Called, but no answer, left message to check mychart.

## 2019-03-30 NOTE — Telephone Encounter (Signed)
Called and released to [employer]

## 2019-03-31 LAB — NUSWAB VAGINITIS PLUS (VG+)
Candida albicans, NAA: NEGATIVE
Candida glabrata, NAA: NEGATIVE
Chlamydia trachomatis, NAA: NEGATIVE
Neisseria gonorrhoeae, NAA: NEGATIVE
Trich vag by NAA: NEGATIVE

## 2019-03-31 NOTE — Progress Notes (Signed)
Called and discussed with patient

## 2019-05-11 ENCOUNTER — Telehealth: Payer: Self-pay

## 2019-05-11 NOTE — Telephone Encounter (Signed)
Patient was seen 03/27/2019 and she received a bill from [facility] for $195 stating her thyroid test wasn't covered due to the wording. Requesting provider to review and contact her back. JO#[phone]

## 2019-05-13 NOTE — Telephone Encounter (Signed)
LMVM TRC to notify if insurance gave a list of diagnosis that would be covered for this lab.

## 2019-05-14 NOTE — Telephone Encounter (Signed)
Spoke w/patient. She reports that her insurance won't pay for the thyroid lab under the general lab code. She had her thyroid checked earlier in the year at [facility] and it was covered. She mentioned that she had been having issues and possibly resubmitting with one of those issues as a diagnosis may be covered.

## 2019-05-15 NOTE — Telephone Encounter (Signed)
I don't know how to go about that in the computer

## 2019-05-20 NOTE — Telephone Encounter (Signed)
N93. 9 - Abnormal uterine and vaginal bleeding, unspecified. ICD-10-CM

## 2019-05-22 NOTE — Telephone Encounter (Signed)
Spoke w/[facility] patient billing. Additional Diagnosis code N93.9 given for 03/27/2019 labs to be refiled. Representative verified diagnosis updated in system and claim will be reprocessed. Patient's bill will be placed on hold.

## 2019-05-22 NOTE — Telephone Encounter (Signed)
Notifed patient of claim reprocessing. Advised of bill being placed on hold and per [facility] representative, advised patient to disregard any bill received and wait at least 30-45 days to allow time for Insurance processing before she inquires again.

## 2019-09-04 ENCOUNTER — Ambulatory Visit: Payer: Self-pay | Attending: Internal Medicine

## 2019-09-04 DIAGNOSIS — Z23 Encounter for immunization: Secondary | ICD-10-CM

## 2019-09-04 NOTE — Progress Notes (Signed)
   Covid-19 Vaccination Clinic  Name:  Sara Stevenson    MRN: [id number] DOB: Nov 23, 1990  09/04/2019  Sara Stevenson was observed post Covid-19 immunization for 15 minutes without incident. She was provided with Vaccine Information Sheet and instruction to access the V-Safe system.   Sara Stevenson was instructed to call 911 with any severe reactions post vaccine: Marland Kitchen Difficulty breathing  . Swelling of face and throat  . A fast heartbeat  . A bad rash all over body  . Dizziness and weakness   Immunizations Administered    Name Date Dose VIS Date Route   Pfizer COVID-19 Vaccine 09/04/2019  1:20 PM 0.3 mL 05/22/2019 Intramuscular   Manufacturer: [employer], [employer]   Lot: [id number]   NDC: [address]-1

## 2019-09-24 ENCOUNTER — Telehealth: Payer: Self-pay

## 2019-09-24 NOTE — Telephone Encounter (Signed)
Spoke w/patient. Inquired if she had any other/additional (life stressors) going on. She reports she doesn't. She read that the vaccine could cause irregular bleeding. She usually has a 3 day cycle and it's been on for 7 days. Flow is normal. Advised to monitor, given heavy bleeding protocol. Pt will f/u with continued/additional concerns.

## 2019-09-24 NOTE — Telephone Encounter (Addendum)
Patient reports she recently received her first Pfizer vaccine recently. Her period started a week early and it's been on for seven days. She is inquiring if this is normal. She is due for her second dose next Wednesday. [id number]#[phone]

## 2019-09-30 ENCOUNTER — Ambulatory Visit: Payer: Self-pay | Attending: Internal Medicine

## 2019-09-30 DIAGNOSIS — Z23 Encounter for immunization: Secondary | ICD-10-CM

## 2019-09-30 NOTE — Progress Notes (Signed)
   Covid-19 Vaccination Clinic  Name:  Sahmya Arai    MRN: [id number] DOB: 04-16-1991  09/30/2019  Ms. Metivier was observed post Covid-19 immunization for 15 minutes without incident. She was provided with Vaccine Information Sheet and instruction to access the V-Safe system.   Ms. Hogans was instructed to call 911 with any severe reactions post vaccine: Marland Kitchen Difficulty breathing  . Swelling of face and throat  . A fast heartbeat  . A bad rash all over body  . Dizziness and weakness   Immunizations Administered    Name Date Dose VIS Date Route   Pfizer COVID-19 Vaccine 09/30/2019  8:33 AM 0.3 mL 08/05/2018 Intramuscular   Manufacturer: [employer], [employer]   Lot: [id number]   NDC: [address]-2

## 2019-11-19 ENCOUNTER — Other Ambulatory Visit: Payer: Self-pay | Admitting: Obstetrics and Gynecology

## 2019-11-19 DIAGNOSIS — Z3009 Encounter for other general counseling and advice on contraception: Secondary | ICD-10-CM

## 2020-02-06 DIAGNOSIS — Z113 Encounter for screening for infections with a predominantly sexual mode of transmission: Secondary | ICD-10-CM | POA: Diagnosis not present

## 2020-02-06 DIAGNOSIS — R350 Frequency of micturition: Secondary | ICD-10-CM | POA: Diagnosis not present

## 2020-03-08 DIAGNOSIS — Z114 Encounter for screening for human immunodeficiency virus [HIV]: Secondary | ICD-10-CM | POA: Diagnosis not present

## 2020-03-08 DIAGNOSIS — R7303 Prediabetes: Secondary | ICD-10-CM | POA: Diagnosis not present

## 2020-03-08 DIAGNOSIS — Z13220 Encounter for screening for lipoid disorders: Secondary | ICD-10-CM | POA: Diagnosis not present

## 2020-03-08 DIAGNOSIS — Z3041 Encounter for surveillance of contraceptive pills: Secondary | ICD-10-CM | POA: Diagnosis not present

## 2020-03-08 DIAGNOSIS — Z0000 Encounter for general adult medical examination without abnormal findings: Secondary | ICD-10-CM | POA: Diagnosis not present

## 2020-03-08 DIAGNOSIS — Z1331 Encounter for screening for depression: Secondary | ICD-10-CM | POA: Diagnosis not present

## 2020-03-17 DIAGNOSIS — Z23 Encounter for immunization: Secondary | ICD-10-CM | POA: Diagnosis not present

## 2020-04-01 ENCOUNTER — Ambulatory Visit: Payer: BC Managed Care – PPO | Admitting: Obstetrics and Gynecology

## 2020-04-04 ENCOUNTER — Other Ambulatory Visit: Payer: Self-pay | Admitting: Obstetrics and Gynecology

## 2020-04-04 DIAGNOSIS — Z3009 Encounter for other general counseling and advice on contraception: Secondary | ICD-10-CM

## 2020-04-08 ENCOUNTER — Ambulatory Visit (INDEPENDENT_AMBULATORY_CARE_PROVIDER_SITE_OTHER): Payer: BC Managed Care – PPO | Admitting: Obstetrics and Gynecology

## 2020-04-08 ENCOUNTER — Encounter: Payer: Self-pay | Admitting: Obstetrics and Gynecology

## 2020-04-08 ENCOUNTER — Other Ambulatory Visit: Payer: Self-pay

## 2020-04-08 VITALS — BP 120/72 | Ht 66.0 in | Wt 294.2 lb

## 2020-04-08 DIAGNOSIS — Z0000 Encounter for general adult medical examination without abnormal findings: Secondary | ICD-10-CM | POA: Diagnosis not present

## 2020-04-08 DIAGNOSIS — Z6841 Body Mass Index (BMI) 40.0 and over, adult: Secondary | ICD-10-CM

## 2020-04-08 DIAGNOSIS — Z131 Encounter for screening for diabetes mellitus: Secondary | ICD-10-CM | POA: Diagnosis not present

## 2020-04-08 DIAGNOSIS — Z130 Encounter for screening for diseases of the blood and blood-forming organs and certain disorders involving the immune mechanism: Secondary | ICD-10-CM

## 2020-04-08 DIAGNOSIS — Z113 Encounter for screening for infections with a predominantly sexual mode of transmission: Secondary | ICD-10-CM | POA: Diagnosis not present

## 2020-04-08 DIAGNOSIS — Z13220 Encounter for screening for lipoid disorders: Secondary | ICD-10-CM

## 2020-04-08 DIAGNOSIS — R7303 Prediabetes: Secondary | ICD-10-CM

## 2020-04-08 DIAGNOSIS — Z3041 Encounter for surveillance of contraceptive pills: Secondary | ICD-10-CM

## 2020-04-08 DIAGNOSIS — Z01419 Encounter for gynecological examination (general) (routine) without abnormal findings: Secondary | ICD-10-CM

## 2020-04-08 DIAGNOSIS — Z1329 Encounter for screening for other suspected endocrine disorder: Secondary | ICD-10-CM

## 2020-04-08 DIAGNOSIS — Z3009 Encounter for other general counseling and advice on contraception: Secondary | ICD-10-CM

## 2020-04-08 MED ORDER — DESOGESTREL-ETHINYL ESTRADIOL 0.15-30 MG-MCG PO TABS: 1.0000 | ORAL_TABLET | Freq: Every day | ORAL | 11 refills | Status: DC

## 2020-04-08 NOTE — Progress Notes (Signed)
Pt is here for her annual exam

## 2020-04-08 NOTE — Patient Instructions (Addendum)
Institute of Medicine Recommended Dietary Allowances for Calcium and Vitamin D  Age (yr) Calcium Recommended Dietary Allowance (mg/day) Vitamin D Recommended Dietary Allowance (international units/day)  9-18 1,300 600  19-50 1,000 600  51-70 1,200 600  71 and older 1,200 800  Data from Institute of Medicine. Dietary reference intakes: calcium, vitamin D. [place], [place]: [employer]; 2011.    Quillian Quince MD Address: [address] [place], [place], [place] [address] Phone: [phone]   Exercising to Stay Healthy To become healthy and stay healthy, it is recommended that you do moderate-intensity and vigorous-intensity exercise. You can tell that you are exercising at a moderate intensity if your heart starts beating faster and you start breathing faster but can still hold a conversation. You can tell that you are exercising at a vigorous intensity if you are breathing much harder and faster and cannot hold a conversation while exercising. Exercising regularly is important. It has many health benefits, such as:  Improving overall fitness, flexibility, and endurance.  Increasing bone density.  Helping with weight control.  Decreasing body fat.  Increasing muscle strength.  Reducing stress and tension.  Improving overall health. How often should I exercise? Choose an activity that you enjoy, and set realistic goals. Your health care provider can help you make an activity plan that works for you. Exercise regularly as told by your health care provider. This may include:  Doing strength training two times a week, such as: ? Lifting weights. ? Using resistance bands. ? Push-ups. ? Sit-ups. ? Yoga.  Doing a certain intensity of exercise for a given amount of time. Choose from these options: ? A total of 150 minutes of moderate-intensity exercise every week. ? A total of 75 minutes of vigorous-intensity exercise every week. ? A mix of moderate-intensity and  vigorous-intensity exercise every week. Children, pregnant women, people who have not exercised regularly, people who are overweight, and older adults may need to talk with a health care provider about what activities are safe to do. If you have a medical condition, be sure to talk with your health care provider before you start a new exercise program. What are some exercise ideas? Moderate-intensity exercise ideas include:  Walking 1 mile (1.6 km) in about 15 minutes.  Biking.  Hiking.  Golfing.  Dancing.  Water aerobics. Vigorous-intensity exercise ideas include:  Walking 4.5 miles (7.2 km) or more in about 1 hour.  Jogging or running 5 miles (8 km) in about 1 hour.  Biking 10 miles (16.1 km) or more in about 1 hour.  Lap swimming.  Roller-skating or in-line skating.  Cross-country skiing.  Vigorous competitive sports, such as football, basketball, and soccer.  Jumping rope.  Aerobic dancing. What are some everyday activities that can help me to get exercise?  Yard work, such as: ? Pushing a Surveyor, mining. ? Raking and bagging leaves.  Washing your car.  Pushing a stroller.  Shoveling snow.  Gardening.  Washing windows or floors. How can I be more active in my day-to-day activities?  Use stairs instead of an elevator.  Take a walk during your lunch break.  If you drive, park your car farther away from your work or school.  If you take public transportation, get off one stop early and walk the rest of the way.  Stand up or walk around during all of your indoor phone calls.  Get up, stretch, and walk around every 30 minutes throughout the day.  Enjoy exercise with a friend. Support  to continue exercising will help you keep a regular routine of activity. What guidelines can I follow while exercising?  Before you start a new exercise program, talk with your health care provider.  Do not exercise so much that you hurt yourself, feel dizzy, or get very  short of breath.  Wear comfortable clothes and wear shoes with good support.  Drink plenty of water while you exercise to prevent dehydration or heat stroke.  Work out until your breathing and your heartbeat get faster. Where to find more information  U.S. Department of Health and Human Services: ThisPath.fi  Centers for Disease Control and Prevention (CDC): FootballExhibition.com.br Summary  Exercising regularly is important. It will improve your overall fitness, flexibility, and endurance.  Regular exercise also will improve your overall health. It can help you control your weight, reduce stress, and improve your bone density.  Do not exercise so much that you hurt yourself, feel dizzy, or get very short of breath.  Before you start a new exercise program, talk with your health care provider. This information is not intended to replace advice given to you by your health care provider. Make sure you discuss any questions you have with your health care provider. Document Revised: 05/10/2017 Document Reviewed: 04/18/2017 Elsevier Patient Education  2020 [employer].   Budget-Friendly Healthy Eating There are many ways to save money at the grocery store and continue to eat healthy. You can be successful if you:  Plan meals according to your budget.  Make a grocery list and only purchase food according to your grocery list.  Prepare food yourself. What are tips for following this plan?  Reading food labels  Compare food labels between brand name foods and the store brand. Often the nutritional value is the same, but the store brand is lower cost.  Look for products that do not have added sugar, fat, or salt (sodium). These often cost the same but are healthier for you. Products may be labeled as: ? Sugar-free. ? Nonfat. ? Low-fat. ? Sodium-free. ? Low-sodium.  Look for lean ground beef labeled as at least 92% lean and 8% fat. Shopping  Buy only the items on your grocery list and go  only to the areas of the store that have the items on your list.  Use coupons only for foods and brands you normally buy. Avoid buying items you wouldn't normally buy simply because they are on sale.  Check online and in newspapers for weekly deals.  Buy healthy items from the bulk bins when available, such as herbs, spices, flour, pasta, nuts, and dried fruit.  Buy fruits and vegetables that are in season. Prices are usually lower on in-season produce.  Look at the unit price on the price tag. Use it to compare different brands and sizes to find out which item is the best deal.  Choose healthy items that are often low-cost, such as carrots, potatoes, apples, bananas, and oranges. Dried or canned beans are a low-cost protein source.  Buy in bulk and freeze extra food. Items you can buy in bulk include meats, fish, poultry, frozen fruits, and frozen vegetables.  Avoid buying "ready-to-eat" foods, such as pre-cut fruits and vegetables and pre-made salads.  If possible, shop around to discover where you can find the best prices. Consider other retailers such as dollar stores, larger [employer], local fruit and vegetable stands, and farmers markets.  Do not shop when you are hungry. If you shop while hungry, it may be hard to  stick to your list and budget.  Resist impulse buying. Use your grocery list as your official plan for the week.  Buy a variety of vegetables and fruits by purchasing fresh, frozen, and canned items.  Look at the top and bottom shelves for deals. Foods at eye level (eye level of an adult or child) are usually more expensive.  Be efficient with your time when shopping. The more time you spend at the store, the more money you are likely to spend.  To save money when choosing more expensive foods like meats and dairy: ? Choose cheaper cuts of meat, such as bone-in chicken thighs and drumsticks instead of skinless and boneless chicken. When you are ready to prepare  the chicken, you can remove the skin yourself to make it healthier. ? Choose lean meats like chicken or [place] instead of beef. ? Choose canned seafood, such as tuna, salmon, or sardines. ? Buy eggs as a low-cost source of protein. ? Buy dried beans and peas, such as lentils, split peas, or kidney beans instead of meats. Dried beans and peas are a good alternative source of protein. ? Buy the larger tubs of yogurt instead of individual-sized containers.  Choose water instead of sodas and other sweetened beverages.  Avoid buying chips, cookies, and other "junk food." These items are usually expensive and not healthy. Cooking  Make extra food and freeze the extras in meal-sized containers or in individual portions for fast meals and snacks.  Pre-cook on days when you have extra time to prepare meals in advance. You can keep these meals in the fridge or freezer and reheat for a quick meal.  When you come home from the grocery store, wash, peel, and cut fruits and vegetables so they are ready to use and eat. This will help reduce food waste. Meal planning  Do not eat out or get fast food. Prepare food at home.  Make a grocery list and make sure to bring it with you to the store. If you have a smart phone, you could use your phone to create your shopping list.  Plan meals and snacks according to a grocery list and budget you create.  Use leftovers in your meal plan for the week.  Look for recipes where you can cook once and make enough food for two meals.  Include budget-friendly meals like stews, casseroles, and stir-fry dishes.  Try some meatless meals or try "no cook" meals like salads.  Make sure that half your plate is filled with fruits or vegetables. Choose from fresh, frozen, or canned fruits and vegetables. If eating canned, remember to rinse them before eating. This will remove any excess salt added for packaging. Summary  Eating healthy on a budget is possible if you plan  your meals according to your budget, purchase according to your budget and grocery list, and prepare food yourself.  Tips for buying more food on a limited budget include buying generic brands, using coupons only for foods you normally buy, and buying healthy items from the bulk bins when available.  Tips for buying cheaper food to replace expensive food include choosing cheaper, lean cuts of meat, and buying dried beans and peas. This information is not intended to replace advice given to you by your health care provider. Make sure you discuss any questions you have with your health care provider. Document Revised: 05/29/2017 Document Reviewed: 05/29/2017 Elsevier Patient Education  2020 Elsevier Inc.   Bone Health Bones protect organs, store calcium, anchor  muscles, and support the whole body. Keeping your bones strong is important, especially as you get older. You can take actions to help keep your bones strong and healthy. Why is keeping my bones healthy important?  Keeping your bones healthy is important because your body constantly replaces bone cells. Cells get old, and new cells take their place. As we age, we lose bone cells because the body may not be able to make enough new cells to replace the old cells. The amount of bone cells and bone tissue you have is referred to as bone mass. The higher your bone mass, the stronger your bones. The aging process leads to an overall loss of bone mass in the body, which can increase the likelihood of:  Joint pain and stiffness.  Broken bones.  A condition in which the bones become weak and brittle (osteoporosis). A large decline in bone mass occurs in older adults. In women, it occurs about the time of menopause. What actions can I take to keep my bones healthy? Good health habits are important for maintaining healthy bones. This includes eating nutritious foods and exercising regularly. To have healthy bones, you need to get enough of the  right minerals and vitamins. Most nutrition experts recommend getting these nutrients from the foods that you eat. In some cases, taking supplements may also be recommended. Doing certain types of exercise is also important for bone health. What are the nutritional recommendations for healthy bones?  Eating a well-balanced diet with plenty of calcium and vitamin D will help to protect your bones. Nutritional recommendations vary from person to person. Ask your health care provider what is healthy for you. Here are some general guidelines. Get enough calcium Calcium is the most important (essential) mineral for bone health. Most people can get enough calcium from their diet, but supplements may be recommended for people who are at risk for osteoporosis. Good sources of calcium include:  Dairy products, such as low-fat or nonfat milk, cheese, and yogurt.  Dark green leafy vegetables, such as bok choy and broccoli.  Calcium-fortified foods, such as orange juice, cereal, bread, soy beverages, and tofu products.  Nuts, such as almonds. Follow these recommended amounts for daily calcium intake:  Children, age 51-3: 700 mg.  Children, age 21-8: 1,000 mg.  Children, age 73-13: 1,300 mg.  Teens, age 38-18: 1,300 mg.  Adults, age 66-50: 1,000 mg.  Adults, age 38-70: ? Men: 1,000 mg. ? Women: 1,200 mg.  Adults, age 213 or older: 1,200 mg.  Pregnant and breastfeeding females: ? Teens: 1,300 mg. ? Adults: 1,000 mg. Get enough vitamin D Vitamin D is the most essential vitamin for bone health. It helps the body absorb calcium. Sunlight stimulates the skin to make vitamin D, so be sure to get enough sunlight. If you live in a cold climate or you do not get outside often, your health care provider may recommend that you take vitamin D supplements. Good sources of vitamin D in your diet include:  Egg yolks.  Saltwater fish.  Milk and cereal fortified with vitamin D. Follow these recommended  amounts for daily vitamin D intake:  Children and teens, age 51-18: 600 international units.  Adults, age 211 or younger: 400-800 international units.  Adults, age 77 or older: 800-1,000 international units. Get other important nutrients Other nutrients that are important for bone health include:  Phosphorus. This mineral is found in meat, poultry, dairy foods, nuts, and legumes. The recommended daily intake for adult men and  adult women is 700 mg.  Magnesium. This mineral is found in seeds, nuts, dark green vegetables, and legumes. The recommended daily intake for adult men is 400-420 mg. For adult women, it is 310-320 mg.  Vitamin K. This vitamin is found in green leafy vegetables. The recommended daily intake is 120 mg for adult men and 90 mg for adult women. What type of physical activity is best for building and maintaining healthy bones? Weight-bearing and strength-building activities are important for building and maintaining healthy bones. Weight-bearing activities cause muscles and bones to work against gravity. Strength-building activities increase the strength of the muscles that support bones. Weight-bearing and muscle-building activities include:  Walking and hiking.  Jogging and running.  Dancing.  Gym exercises.  Lifting weights.  Tennis and racquetball.  Climbing stairs.  Aerobics. Adults should get at least 30 minutes of moderate physical activity on most days. Children should get at least 60 minutes of moderate physical activity on most days. Ask your health care provider what type of exercise is best for you. How can I find out if my bone mass is low? Bone mass can be measured with an X-ray test called a bone mineral density (BMD) test. This test is recommended for all women who are age 87 or older. It may also be recommended for:  Men who are age 43 or older.  People who are at risk for osteoporosis because of: ? Having bones that break easily. ? Having a  long-term disease that weakens bones, such as kidney disease or rheumatoid arthritis. ? Having menopause earlier than normal. ? Taking medicine that weakens bones, such as steroids, thyroid hormones, or hormone treatment for breast cancer or prostate cancer. ? Smoking. ? Drinking three or more alcoholic drinks a day. If you find that you have a low bone mass, you may be able to prevent osteoporosis or further bone loss by changing your diet and lifestyle. Where can I find more information? For more information, check out the following websites:  National Osteoporosis Foundation: https://carlson-fletcher.info/  [employer]: www.bones.http://www.myers.net/  International Osteoporosis Foundation: Investment banker, operational.iofbonehealth.org Summary  The aging process leads to an overall loss of bone mass in the body, which can increase the likelihood of broken bones and osteoporosis.  Eating a well-balanced diet with plenty of calcium and vitamin D will help to protect your bones.  Weight-bearing and strength-building activities are also important for building and maintaining strong bones.  Bone mass can be measured with an X-ray test called a bone mineral density (BMD) test. This information is not intended to replace advice given to you by your health care provider. Make sure you discuss any questions you have with your health care provider. Document Revised: 06/24/2017 Document Reviewed: 06/24/2017 Elsevier Patient Education  2020 [employer].   Prediabetes Prediabetes is the condition of having a blood sugar (blood glucose) level that is higher than it should be, but not high enough for you to be diagnosed with type 2 diabetes. Having prediabetes puts you at risk for developing type 2 diabetes (type 2 diabetes mellitus). Prediabetes may be called impaired glucose tolerance or impaired fasting glucose. Prediabetes usually does not cause symptoms. Your health care provider can diagnose this condition with blood  tests. You may be tested for prediabetes if you are overweight and if you have at least one other risk factor for prediabetes. What is blood glucose, and how is it measured? Blood glucose refers to the amount of glucose in your bloodstream. Glucose comes  from eating foods that contain sugars and starches (carbohydrates), which the body breaks down into glucose. Your blood glucose level may be measured in mg/dL (milligrams per deciliter) or mmol/L (millimoles per liter). Your blood glucose may be checked with one or more of the following blood tests:  A fasting blood glucose (FBG) test. You will not be allowed to eat (you will fast) for 8 hours or longer before a blood sample is taken. ? A normal range for FBG is 70-100 mg/dl (0.2-7.7 mmol/L).  An A1c (hemoglobin A1c) blood test. This test provides information about blood glucose control over the previous 2?70months.  An oral glucose tolerance test (OGTT). This test measures your blood glucose at two times: ? After fasting. This is your baseline level. ? Two hours after you drink a beverage that contains glucose. You may be diagnosed with prediabetes:  If your FBG is 100?125 mg/dL (4.1-2.8 mmol/L).  If your A1c level is 5.7?6.4%.  If your OGTT result is 140?199 mg/dL (7.8-67 mmol/L). These blood tests may be repeated to confirm your diagnosis. How can this condition affect me? The pancreas produces a hormone (insulin) that helps to move glucose from the bloodstream into cells. When cells in the body do not respond properly to insulin that the body makes (insulin resistance), excess glucose builds up in the blood instead of going into cells. As a result, high blood glucose (hyperglycemia) can develop, which can cause many complications. Hyperglycemia is a symptom of prediabetes. Having high blood glucose for a long time is dangerous. Too much glucose in your blood can damage your nerves and blood vessels. Long-term damage can lead to  complications from diabetes, which may include:  Heart disease.  Stroke.  Blindness.  Kidney disease.  Depression.  Poor circulation in the feet and legs, which could lead to surgical removal (amputation) in severe cases. What can increase my risk? Risk factors for prediabetes include:  Having a family member with type 2 diabetes.  Being overweight or obese.  Being older than age 44.  Being of American [place], African-American, Hispanic/Latino, or Asian/Pacific Islander descent.  Having an inactive (sedentary) lifestyle.  Having a history of heart disease.  History of gestational diabetes or polycystic ovary syndrome (PCOS), in women.  Having low levels of good cholesterol (HDL-C) or high levels of blood fats (triglycerides).  Having high blood pressure. What actions can I take to prevent diabetes?      Be physically active. ? Do moderate-intensity physical activity for 30 or more minutes on 5 or more days of the week, or as much as told by your health care provider. This could be brisk walking, biking, or water aerobics. ? Ask your health care provider what activities are safe for you. A mix of physical activities may be best, such as walking, swimming, cycling, and strength training.  Lose weight as told by your health care provider. ? Losing 5-7% of your body weight can reverse insulin resistance. ? Your health care provider can determine how much weight loss is best for you and can help you lose weight safely.  Follow a healthy meal plan. This includes eating lean proteins, complex carbohydrates, fresh fruits and vegetables, low-fat dairy products, and healthy fats. ? Follow instructions from your health care provider about eating or drinking restrictions. ? Make an appointment to see a diet and nutrition specialist (registered dietitian) to help you create a healthy eating plan that is right for you.  Do not smoke or use any tobacco products,  such as cigarettes,  chewing tobacco, and e-cigarettes. If you need help quitting, ask your health care provider.  Take over-the-counter and prescription medicines as told by your health care provider. You may be prescribed medicines that help lower the risk of type 2 diabetes.  Keep all follow-up visits as told by your health care provider. This is important. Summary  Prediabetes is the condition of having a blood sugar (blood glucose) level that is higher than it should be, but not high enough for you to be diagnosed with type 2 diabetes.  Having prediabetes puts you at risk for developing type 2 diabetes (type 2 diabetes mellitus).  To help prevent type 2 diabetes, make lifestyle changes such as being physically active and eating a healthy diet. Lose weight as told by your health care provider. This information is not intended to replace advice given to you by your health care provider. Make sure you discuss any questions you have with your health care provider. Document Revised: 09/19/2018 Document Reviewed: 07/19/2015 Elsevier Patient Education  2020 [employer].

## 2020-04-08 NOTE — Progress Notes (Signed)
Gynecology Annual Exam  PCP: Pcp, No  Chief Complaint:  Chief Complaint  Patient presents with   Gynecologic Exam    annual exam     History of Present Illness: Patient is a 29 y.o. G0P0000 presents for annual exam. The patient has no complaints today.   LMP: Patient's last menstrual period was 03/11/2020. Average Interval: regular, 28 days Duration of flow: several days Heavy Menses: no Clots: no Intermenstrual Bleeding: no Postcoital Bleeding: no Dysmenorrhea: no   She currently uses OCP (estrogen/progesterone) for contraception. She denies dyspareunia.  The patient does perform self breast exams.  There is no notable family history of breast or ovarian cancer in her family.   The patient denies current symptoms of depression.    Review of Systems: ROS  Past Medical History:  Patient Active Problem List   Diagnosis Date Noted   Prediabetes 03/08/2020   Morbid obesity (HCC) 02/03/2017   Contraceptive management 02/03/2017   At risk for diabetes mellitus 07/29/2015    Past Surgical History:  Past Surgical History:  Procedure Laterality Date   COLPOSCOPY  05/04/2013   negative   WISDOM TOOTH EXTRACTION      Gynecologic History:  Patient's last menstrual period was 03/11/2020. Contraception: OCP (estrogen/progesterone) Last Pap: Results were: 2019 NIL    Obstetric History: G0P0000  Family History:  Family History  Problem Relation Age of Onset   Diabetes Other    Heart disease Neg Hx    Hypertension Neg Hx    Cancer Neg Hx     Social History:  Social History   Socioeconomic History   Marital status: Single    Spouse name: Not on file   Number of children: 0   Years of education: 14   Highest education level: Not on file  Occupational History   Occupation: Sales executive   Occupation: CUSTOMER SERVICE    Comment: [employer]  Tobacco Use   Smoking status: Never Smoker   Smokeless tobacco: Never Used  Haematologist Use: Never used  Substance and Sexual Activity   Alcohol use: Yes    Comment: occasionally   Drug use: No   Sexual activity: Yes    Partners: Male    Birth control/protection: Pill  Other Topics Concern   Not on file  Social History Narrative   Not on file   Social Determinants of Health   Financial Resource Strain:    Difficulty of Paying Living Expenses: Not on file  Food Insecurity:    Worried About Programme researcher, broadcasting/film/video in the Last Year: Not on file   [employer] of Food in the Last Year: Not on file  Transportation Needs:    Lack of Transportation (Medical): Not on file   Lack of Transportation (Non-Medical): Not on file  Physical Activity:    Days of Exercise per Week: Not on file   Minutes of Exercise per Session: Not on file  Stress:    Feeling of Stress : Not on file  Social Connections:    Frequency of Communication with Friends and Family: Not on file   Frequency of Social Gatherings with Friends and Family: Not on file   Attends Religious Services: Not on file   Active Member of Clubs or Organizations: Not on file   Attends Banker Meetings: Not on file   Marital Status: Not on file  Intimate Partner Violence:    Fear of Current or Ex-Partner: Not on file  Emotionally Abused: Not on file   Physically Abused: Not on file   Sexually Abused: Not on file    Allergies:  No Known Allergies  Medications: Prior to Admission medications   Medication Sig Start Date End Date Taking? Authorizing Provider  desogestrel-ethinyl estradiol (ISIBLOOM) 0.15-30 MG-MCG tablet Take 1 tablet by mouth daily. 04/08/20   Natale Milch, MD    Physical Exam Vitals: Blood pressure 120/72, height 5\' 6"  (1.676 m), weight 294 lb 3.2 oz (133.4 kg), last menstrual period 03/11/2020.  General: NAD HEENT: normocephalic, anicteric Thyroid: no enlargement, no palpable nodules Pulmonary: No increased work of breathing, CTAB Cardiovascular:  RRR, distal pulses 2+ Breast: Breast symmetrical, no tenderness, no palpable nodules or masses, no skin or nipple retraction present, no nipple discharge.  No axillary or supraclavicular lymphadenopathy. Abdomen: NABS, soft, non-tender, non-distended.  Umbilicus without lesions.  No hepatomegaly, splenomegaly or masses palpable. No evidence of hernia  Genitourinary:  External: Normal external female genitalia.  Normal urethral meatus, normal Bartholin's and Skene's glands.    Vagina: Normal vaginal mucosa, no evidence of prolapse.    Cervix: Grossly normal in appearance, no bleeding  Uterus: Non-enlarged, mobile, normal contour.  No CMT  Adnexa: ovaries non-enlarged, no adnexal masses  Rectal: deferred  Lymphatic: no evidence of inguinal lymphadenopathy Extremities: no edema, erythema, or tenderness Neurologic: Grossly intact Psychiatric: mood appropriate, affect full  Female chaperone present for pelvic and breast  portions of the physical exam    Assessment: 29 y.o. G0P0000 routine annual exam  Plan: Problem List Items Addressed This Visit      Other   Contraceptive management   Relevant Medications   desogestrel-ethinyl estradiol (ISIBLOOM) 0.15-30 MG-MCG tablet   Prediabetes   Relevant Orders   Referral to Nutrition and Diabetes Services   Hemoglobin A1c    Other Visit Diagnoses    Screening examination for STD (sexually transmitted disease)    -  Primary   Relevant Orders   NuSwab Vaginitis Plus (VG+)   Healthcare maintenance       Encounter for annual routine gynecological examination       Encounter for gynecological examination without abnormal finding       Screening for diabetes mellitus       Relevant Orders   Comprehensive metabolic panel   Hemoglobin A1c   Screening for deficiency anemia       Relevant Orders   CBC With Differential   Screening for thyroid disorder       Relevant Orders   TSH   T4, free   Screening cholesterol level       Relevant  Orders   Lipid panel   BMI 45.0-49.9, adult (HCC)       Relevant Orders   Referral to Nutrition and Diabetes Services   Ambulatory referral to [facility]   Encounter for birth control pills maintenance          1) STI screening  was offered and accepted  2) ASCCP guidelines and rational discussed.  Patient opts for every 3 years screening interval  3) Contraception - the patient is currently using  OCP (estrogen/progesterone).  She is happy with her current form of contraception and plans to continue  4) Obesity and prediabetes. Patient is motivated to loose weight for her health. She would like assistance in doing so. Will refer to [facility]. [facility]. Given hand out regarding medical weight loss options. She will consider and return to discuss further. Referral to nutrition made. She reports  discussing prediabetes with PCP, but was not offered metformin in the past. Will follow up in 4 weeks to discuss more.    5) Routine healthcare maintenance including cholesterol, diabetes screening discussed To return fasting at a later date  6) Return in about 4 weeks (around 05/06/2020) for GYN visit.   Natale Milch [facility], [facility] 04/08/2020, 4:11 PM

## 2020-04-12 LAB — NUSWAB VAGINITIS PLUS (VG+)
Candida albicans, NAA: NEGATIVE
Candida glabrata, NAA: NEGATIVE
Chlamydia trachomatis, NAA: NEGATIVE
Neisseria gonorrhoeae, NAA: NEGATIVE
Trich vag by NAA: NEGATIVE

## 2020-05-02 ENCOUNTER — Other Ambulatory Visit: Payer: Self-pay

## 2020-05-02 ENCOUNTER — Other Ambulatory Visit: Payer: BC Managed Care – PPO

## 2020-05-02 DIAGNOSIS — Z1329 Encounter for screening for other suspected endocrine disorder: Secondary | ICD-10-CM

## 2020-05-02 DIAGNOSIS — Z131 Encounter for screening for diabetes mellitus: Secondary | ICD-10-CM

## 2020-05-02 DIAGNOSIS — Z130 Encounter for screening for diseases of the blood and blood-forming organs and certain disorders involving the immune mechanism: Secondary | ICD-10-CM | POA: Diagnosis not present

## 2020-05-02 DIAGNOSIS — Z13220 Encounter for screening for lipoid disorders: Secondary | ICD-10-CM

## 2020-05-02 DIAGNOSIS — R7303 Prediabetes: Secondary | ICD-10-CM | POA: Diagnosis not present

## 2020-05-03 LAB — CBC WITH DIFFERENTIAL
Basophils Absolute: 0 10*3/uL (ref 0.0–0.2)
Basos: 0 %
EOS (ABSOLUTE): 0.2 10*3/uL (ref 0.0–0.4)
Eos: 2 %
Hematocrit: 36.4 % (ref 34.0–46.6)
Hemoglobin: 11.5 g/dL (ref 11.1–15.9)
Immature Grans (Abs): 0 10*3/uL (ref 0.0–0.1)
Immature Granulocytes: 0 %
Lymphocytes Absolute: 2.7 10*3/uL (ref 0.7–3.1)
Lymphs: 30 %
MCH: 25.7 pg — ABNORMAL LOW (ref 26.6–33.0)
MCHC: 31.6 g/dL (ref 31.5–35.7)
MCV: 81 fL (ref 79–97)
Monocytes Absolute: 0.4 10*3/uL (ref 0.1–0.9)
Monocytes: 4 %
Neutrophils Absolute: 5.7 10*3/uL (ref 1.4–7.0)
Neutrophils: 64 %
RBC: 4.47 x10E6/uL (ref 3.77–5.28)
RDW: 14.5 % (ref 11.7–15.4)
WBC: 9 10*3/uL (ref 3.4–10.8)

## 2020-05-03 LAB — COMPREHENSIVE METABOLIC PANEL
ALT: 7 IU/L (ref 0–32)
AST: 11 IU/L (ref 0–40)
Albumin/Globulin Ratio: 1.1 — ABNORMAL LOW (ref 1.2–2.2)
Albumin: 3.7 g/dL — ABNORMAL LOW (ref 3.8–4.8)
Alkaline Phosphatase: 115 IU/L (ref 44–121)
BUN/Creatinine Ratio: 10 (ref 9–23)
BUN: 10 mg/dL (ref 6–20)
Bilirubin Total: 0.2 mg/dL (ref 0.0–1.2)
CO2: 20 mmol/L (ref 20–29)
Calcium: 8.8 mg/dL (ref 8.7–10.2)
Chloride: 107 mmol/L — ABNORMAL HIGH (ref 96–106)
Creatinine, Ser: 0.97 mg/dL (ref 0.57–1.00)
GFR calc Af Amer: 90 mL/min/{1.73_m2} (ref >59)
GFR calc non Af Amer: 78 mL/min/{1.73_m2} (ref >59)
Globulin, Total: 3.4 g/dL (ref 1.5–4.5)
Glucose: 89 mg/dL (ref 65–99)
Potassium: 4.2 mmol/L (ref 3.5–5.2)
Sodium: 140 mmol/L (ref 134–144)
Total Protein: 7.1 g/dL (ref 6.0–8.5)

## 2020-05-03 LAB — TSH: TSH: 2.03 u[IU]/mL (ref 0.450–4.500)

## 2020-05-03 LAB — HEMOGLOBIN A1C
Est. average glucose Bld gHb Est-mCnc: 126 mg/dL
Hgb A1c MFr Bld: 6 % — ABNORMAL HIGH (ref 4.8–5.6)

## 2020-05-03 LAB — LIPID PANEL
Chol/HDL Ratio: 3.1 ratio (ref 0.0–4.4)
Cholesterol, Total: 133 mg/dL (ref 100–199)
HDL: 43 mg/dL (ref >39)
LDL Chol Calc (NIH): 74 mg/dL (ref 0–99)
Triglycerides: 79 mg/dL (ref 0–149)
VLDL Cholesterol Cal: 16 mg/dL (ref 5–40)

## 2020-05-03 LAB — T4, FREE: Free T4: 1.02 ng/dL (ref 0.82–1.77)

## 2020-05-13 ENCOUNTER — Ambulatory Visit (INDEPENDENT_AMBULATORY_CARE_PROVIDER_SITE_OTHER): Payer: BC Managed Care – PPO | Admitting: Obstetrics and Gynecology

## 2020-05-13 ENCOUNTER — Encounter: Payer: Self-pay | Admitting: Obstetrics and Gynecology

## 2020-05-13 ENCOUNTER — Other Ambulatory Visit: Payer: Self-pay

## 2020-05-13 VITALS — BP 118/72 | Ht 65.0 in | Wt 293.8 lb

## 2020-05-13 DIAGNOSIS — R7303 Prediabetes: Secondary | ICD-10-CM | POA: Diagnosis not present

## 2020-05-13 DIAGNOSIS — Z6841 Body Mass Index (BMI) 40.0 and over, adult: Secondary | ICD-10-CM

## 2020-05-13 NOTE — Progress Notes (Signed)
Patient ID: Sara Stevenson, female   DOB: 05-13-1991, 29 y.o.   MRN: [id number]  Reason for Consult: Gynecologic Exam   Referred by No ref. provider found  Subjective:     HPI:  Sara Stevenson is a 29 y.o. female. She is following up today for discussions regarding weight loss.  She was able to make an appointment in [facility] with Dr. Francena Hanly clinic for January 3rd. She  Has been reviewing her options for medical weight loss and is interested in considering Qsymia. She has started a daily self defense class that she is enjoying. It is a small group of 4 women and they meet for an hour a day.   Past Medical History:  Diagnosis Date  . Bacterial vaginitis    recurrrent  . History of abnormal cervical Pap smear 2010; 03/20/13   POS HRHPV  . Morbid obesity (HCC)   . STD (sexually transmitted disease) 03/2012   chlamydia   Family History  Problem Relation Age of Onset  . Diabetes Other   . Heart disease Neg Hx   . Hypertension Neg Hx   . Cancer Neg Hx    Past Surgical History:  Procedure Laterality Date  . COLPOSCOPY  05/04/2013   negative  . WISDOM TOOTH EXTRACTION      Short Social History:  Social History   Tobacco Use  . Smoking status: Never Smoker  . Smokeless tobacco: Never Used  Substance Use Topics  . Alcohol use: Yes    Comment: occasionally    No Known Allergies  Current Outpatient Medications  Medication Sig Dispense Refill  . desogestrel-ethinyl estradiol (ISIBLOOM) 0.15-30 MG-MCG tablet Take 1 tablet by mouth daily. 28 tablet 11   No current facility-administered medications for this visit.    Review of Systems  Constitutional: Negative for chills, fatigue, fever and unexpected weight change.  HENT: Negative for trouble swallowing.  Eyes: Negative for loss of vision.  Respiratory: Negative for cough, shortness of breath and wheezing.  Cardiovascular: Negative for chest pain, leg swelling, palpitations and syncope.  GI: Negative for abdominal  pain, blood in stool, diarrhea, nausea and vomiting.  GU: Negative for difficulty urinating, dysuria, frequency and hematuria.  Musculoskeletal: Negative for back pain, leg pain and joint pain.  Skin: Negative for rash.  Neurological: Negative for dizziness, headaches, light-headedness, numbness and seizures.  Psychiatric: Negative for behavioral problem, confusion, depressed mood and sleep disturbance.        Objective:  Objective   Vitals:   05/13/20 1530  BP: 118/72  Weight: 293 lb 12.8 oz (133.3 kg)  Height: 5\' 5"  (1.651 m)   Body mass index is 48.89 kg/m.  Physical Exam Vitals and nursing note reviewed.  Constitutional:      Appearance: She is well-developed.  HENT:     Head: Normocephalic and atraumatic.  Eyes:     Pupils: Pupils are equal, round, and reactive to light.  Cardiovascular:     Rate and Rhythm: Normal rate and regular rhythm.  Pulmonary:     Effort: Pulmonary effort is normal. No respiratory distress.  Skin:    General: Skin is warm and dry.  Neurological:     Mental Status: She is alert and oriented to person, place, and time.  Psychiatric:        Behavior: Behavior normal.        Thought Content: Thought content normal.        Judgment: Judgment normal.     Assessment/Plan:  29 yo following up for weight loss assistance.  Has close follow up in [place] planned.  Encouraged patient to continue with healthy lifestyle changes.   More than 10 minutes were spent face to face with the patient in the room, reviewing the medical record, labs and images, and coordinating care for the patient. The plan of management was discussed in detail and counseling was provided.    Adelene Idler MD [facility], [facility] 05/13/2020 5:43 PM

## 2020-05-27 DIAGNOSIS — Z03818 Encounter for observation for suspected exposure to other biological agents ruled out: Secondary | ICD-10-CM | POA: Diagnosis not present

## 2020-05-27 DIAGNOSIS — Z1152 Encounter for screening for COVID-19: Secondary | ICD-10-CM | POA: Diagnosis not present

## 2020-06-13 ENCOUNTER — Other Ambulatory Visit: Payer: Self-pay

## 2020-06-13 ENCOUNTER — Encounter (INDEPENDENT_AMBULATORY_CARE_PROVIDER_SITE_OTHER): Payer: Self-pay | Admitting: Bariatrics

## 2020-06-13 ENCOUNTER — Ambulatory Visit (INDEPENDENT_AMBULATORY_CARE_PROVIDER_SITE_OTHER): Payer: BC Managed Care – PPO | Admitting: Bariatrics

## 2020-06-13 VITALS — BP 119/83 | HR 92 | Temp 98.2°F | Ht 65.0 in | Wt 289.0 lb

## 2020-06-13 DIAGNOSIS — R7303 Prediabetes: Secondary | ICD-10-CM | POA: Diagnosis not present

## 2020-06-13 DIAGNOSIS — R5383 Other fatigue: Secondary | ICD-10-CM

## 2020-06-13 DIAGNOSIS — Z0289 Encounter for other administrative examinations: Secondary | ICD-10-CM

## 2020-06-13 DIAGNOSIS — Z9189 Other specified personal risk factors, not elsewhere classified: Secondary | ICD-10-CM

## 2020-06-13 DIAGNOSIS — E538 Deficiency of other specified B group vitamins: Secondary | ICD-10-CM | POA: Diagnosis not present

## 2020-06-13 DIAGNOSIS — R0602 Shortness of breath: Secondary | ICD-10-CM

## 2020-06-13 DIAGNOSIS — Z1331 Encounter for screening for depression: Secondary | ICD-10-CM | POA: Diagnosis not present

## 2020-06-13 DIAGNOSIS — E559 Vitamin D deficiency, unspecified: Secondary | ICD-10-CM | POA: Diagnosis not present

## 2020-06-13 DIAGNOSIS — Z6841 Body Mass Index (BMI) 40.0 and over, adult: Secondary | ICD-10-CM

## 2020-06-13 NOTE — Progress Notes (Signed)
Dear Dr. Jerene Pitch,   Thank you for referring Sara Stevenson to our clinic. The following note includes my evaluation and treatment recommendations.  Chief Complaint:   OBESITY Karlea Mckibbin (MR# [id number]) is a 30 y.o. female who presents for evaluation and treatment of obesity and related comorbidities. Current BMI is Body mass index is 48.09 kg/m. Desiree has been struggling with her weight for many years and has been unsuccessful in either losing weight, maintaining weight loss, or reaching her healthy weight goal.  Lashannon is currently in the action stage of change and ready to dedicate time achieving and maintaining a healthier weight. Bryana is interested in becoming our patient and working on intensive lifestyle modifications including (but not limited to) diet and exercise for weight loss.  [name] eats outside the home.  She struggles with getting adequate vegetables.  Ophia's habits were reviewed today and are as follows: she thinks her family will eat healthier with her, her desired weight loss is 40-60 pounds, she has been heavy most of her life, she started gaining weight around 30-60 years of age, her heaviest weight ever was 290 pounds, she is a picky eater and doesn't like to eat healthier foods, she has craves chicken and rice, she skips dinner frequently, she is frequently drinking liquids with calories, she frequently makes poor food choices and she struggles with emotional eating.  Depression Screen Brook[place]'s Food and Mood (modified PHQ-9) score was 5.  Depression screen [facility] 2/9 06/13/2020  Decreased Interest 0  Down, Depressed, Hopeless 1  PHQ - 2 Score 1  Altered sleeping 1  Tired, decreased energy 1  Change in appetite 0  Feeling bad or failure about yourself  1  Trouble concentrating 1  Moving slowly or fidgety/restless 0  Suicidal thoughts 0  PHQ-9 Score 5  Difficult doing work/chores Not difficult at all   Subjective:   1. Other fatigue [name] admits to  daytime somnolence and reports waking up still tired. Patent has a history of symptoms of daytime fatigue, morning fatigue and snoring. [name] generally gets 6 or 7 hours of sleep per night, and states that she has generally restful sleep. Snoring is present. Apneic episodes are not present. Epworth Sleepiness Score is 10.  2. SOB (shortness of breath) [name] notes increasing shortness of breath with exercising and seems to be worsening over time with weight gain. She notes getting out of breath sooner with activity than she used to. This has gotten worse recently. Trinaty denies shortness of breath at rest or orthopnea.  3. Prediabetes Gunhild has a diagnosis of prediabetes based on her elevated HgA1c and was informed this puts her at greater risk of developing diabetes. She continues to work on diet and exercise to decrease her risk of diabetes. She denies nausea or hypoglycemia.  She has been prediabetic for 1.5 years.  Her A1c is 6.1.  Lab Results  Component Value Date   HGBA1C 6.0 (H) 05/02/2020   4. Vitamin D deficiency She is currently taking no vitamin D supplement. She denies nausea, vomiting or muscle weakness.  5. Vitamin B 12 deficiency She notes fatigue and shortness of breath. She is not a vegetarian.  She does not have a history of weight loss surgery.  She is not on a B12 supplement.  6. Depression screening [name] was screened for depression as part of her new patient workup today.  Her PHQ-9 was 5.  7. At risk for activity intolerance [name] is at risk for activity intolerance  due to obesity and weather changes.  Assessment/Plan:   1. Other fatigue Zeena does feel that her weight is causing her energy to be lower than it should be. Fatigue may be related to obesity, depression or many other causes. Labs will be ordered, and in the meanwhile, [name] will focus on self care including making healthy food choices, increasing physical activity and focusing on stress  reduction.  - EKG 12-Lead  2. SOB (shortness of breath) [name] does feel that she gets out of breath more easily that she used to when she exercises. Thursa's shortness of breath appears to be obesity related and exercise induced. She has agreed to work on weight loss and gradually increase exercise to treat her exercise induced shortness of breath. Will continue to monitor closely.  3. Prediabetes Ebelyn will continue to work on weight loss, exercise, and decreasing simple carbohydrates to help decrease the risk of diabetes.  She will also increase healthy protein and fats.  - Insulin, random  4. Vitamin D deficiency Low Vitamin D level contributes to fatigue and are associated with obesity, breast, and colon cancer.  We will check Shany's vitamin D level today.  - VITAMIN D 25 Hydroxy (Vit-D Deficiency, Fractures)  5. Vitamin B 12 deficiency The diagnosis was reviewed with the patient. Counseling provided today, see below. We will continue to monitor. Orders and follow up as documented in patient record.  We will check her vitamin B12 level today.  Counseling . The body needs vitamin B12: to make red blood cells; to make DNA; and to help the nerves work properly so they can carry messages from the brain to the body.  . The main causes of vitamin B12 deficiency include dietary deficiency, digestive diseases, pernicious anemia, and having a surgery in which part of the stomach or small intestine is removed.  . Certain medicines can make it harder for the body to absorb vitamin B12. These medicines include: heartburn medications; some antibiotics; some medications used to treat diabetes, gout, and high cholesterol.  . In some cases, there are no symptoms of this condition. If the condition leads to anemia or nerve damage, various symptoms can occur, such as weakness or fatigue, shortness of breath, and numbness or tingling in your hands and feet.   . Treatment:  o May include taking vitamin  B12 supplements.  o Avoid alcohol.  o Eat lots of healthy foods that contain vitamin B12: - Beef, pork, chicken, [place], and organ meats, such as liver.  - Seafood: This includes clams, rainbow trout, salmon, tuna, and haddock. Eggs.  - Cereal and dairy products that are fortified: This means that vitamin B12 has been added to the food.   - Vitamin B12  6. Depression screening Aerie's depression screen was negative today.  7. At risk for activity intolerance [name] was given approximately 15 minutes of exercise intolerance counseling today. She is 30 y.o. female and has risk factors exercise intolerance including obesity. We discussed intensive lifestyle modifications today with an emphasis on specific weight loss instructions and strategies. Monnie will slowly increase activity as tolerated.  Repetitive spaced learning was employed today to elicit superior memory formation and behavioral change.  8. Class 3 severe obesity with serious comorbidity and body mass index (BMI) of 45.0 to 49.9 in adult, unspecified obesity type ([place])  Michaline is currently in the action stage of change and her goal is to continue with weight loss efforts. I recommend [name] begin the structured treatment plan as follows:  She has agreed to the Category 2 Plan +100 calories.  She will work on meal planning, intentional eating, and eliminating sugary drinks.  We reviewed labs including TSH, lipids, CMP, and CBC from 05/02/2020.  Exercise goals: No exercise has been prescribed at this time.   Behavioral modification strategies: increasing lean protein intake, decreasing simple carbohydrates, increasing vegetables, increasing water intake, decreasing eating out, no skipping meals, meal planning and cooking strategies, keeping healthy foods in the home, travel eating strategies, holiday eating strategies , celebration eating strategies and avoiding temptations.  She was informed of the importance of frequent  follow-up visits to maximize her success with intensive lifestyle modifications for her multiple health conditions. She was informed we would discuss her lab results at her next visit unless there is a critical issue that needs to be addressed sooner. [name] agreed to keep her next visit at the agreed upon time to discuss these results.  Objective:   Blood pressure 119/83, pulse 92, temperature 98.2 F (36.8 C), temperature source Oral, height 5\' 5"  (1.651 m), weight 289 lb (131.1 kg), SpO2 96 %. Body mass index is 48.09 kg/m.  EKG: Normal sinus rhythm, rate 89 bpm.  Indirect Calorimeter completed today shows a VO2 of 269 and a REE of 1873.  Her calculated basal metabolic rate is thus her basal metabolic rate is worse than expected.  General: Cooperative, alert, well developed, in no acute distress. HEENT: Conjunctivae and lids unremarkable. Cardiovascular: Regular rhythm.  Lungs: Normal work of breathing. Neurologic: No focal deficits.   Lab Results  Component Value Date   CREATININE 0.97 05/02/2020   BUN 10 05/02/2020   NA 140 05/02/2020   K 4.2 05/02/2020   CL 107 (H) 05/02/2020   CO2 20 05/02/2020   Lab Results  Component Value Date   ALT 7 05/02/2020   AST 11 05/02/2020   ALKPHOS 115 05/02/2020   BILITOT 0.2 05/02/2020   Lab Results  Component Value Date   HGBA1C 6.0 (H) 05/02/2020   HGBA1C 6.1 (H) 03/27/2019   HGBA1C 5.9 (H) 03/14/2018   HGBA1C 5.7 (H) 02/01/2017   Lab Results  Component Value Date   TSH 2.030 05/02/2020   Lab Results  Component Value Date   CHOL 133 05/02/2020   HDL 43 05/02/2020   LDLCALC 74 05/02/2020   TRIG 79 05/02/2020   CHOLHDL 3.1 05/02/2020   Lab Results  Component Value Date   WBC 9.0 05/02/2020   HGB 11.5 05/02/2020   HCT 36.4 05/02/2020   MCV 81 05/02/2020   Attestation Statements:   Reviewed by clinician on day of visit: allergies, medications, problem list, medical history, surgical history, family history, social  history, and previous encounter notes.  I, 05/04/2020, CMA, am acting as Insurance claims handler for Energy manager, DO  I have reviewed the above documentation for accuracy and completeness, and I agree with the above. [employer], DO

## 2020-06-14 ENCOUNTER — Encounter (INDEPENDENT_AMBULATORY_CARE_PROVIDER_SITE_OTHER): Payer: Self-pay | Admitting: Bariatrics

## 2020-06-14 DIAGNOSIS — E559 Vitamin D deficiency, unspecified: Secondary | ICD-10-CM | POA: Insufficient documentation

## 2020-06-14 LAB — VITAMIN B12: Vitamin B-12: 212 pg/mL — ABNORMAL LOW (ref 232–1245)

## 2020-06-14 LAB — VITAMIN D 25 HYDROXY (VIT D DEFICIENCY, FRACTURES): Vit D, 25-Hydroxy: 5.9 ng/mL — ABNORMAL LOW (ref 30.0–100.0)

## 2020-06-14 LAB — INSULIN, RANDOM: INSULIN: 32.5 u[IU]/mL — ABNORMAL HIGH (ref 2.6–24.9)

## 2020-06-15 NOTE — Progress Notes (Signed)
Left pt msg to c/b re: labs

## 2020-06-15 NOTE — Progress Notes (Signed)
Patient advised.

## 2020-06-27 ENCOUNTER — Encounter (INDEPENDENT_AMBULATORY_CARE_PROVIDER_SITE_OTHER): Payer: Self-pay | Admitting: Bariatrics

## 2020-06-27 ENCOUNTER — Telehealth (INDEPENDENT_AMBULATORY_CARE_PROVIDER_SITE_OTHER): Payer: BC Managed Care – PPO | Admitting: Bariatrics

## 2020-06-27 DIAGNOSIS — R7303 Prediabetes: Secondary | ICD-10-CM

## 2020-06-27 DIAGNOSIS — Z6841 Body Mass Index (BMI) 40.0 and over, adult: Secondary | ICD-10-CM

## 2020-06-27 DIAGNOSIS — E559 Vitamin D deficiency, unspecified: Secondary | ICD-10-CM | POA: Diagnosis not present

## 2020-06-27 DIAGNOSIS — E538 Deficiency of other specified B group vitamins: Secondary | ICD-10-CM | POA: Diagnosis not present

## 2020-06-27 MED ORDER — VITAMIN D (ERGOCALCIFEROL) 1.25 MG (50000 UNIT) PO CAPS: 50000.0000 [IU] | ORAL_CAPSULE | ORAL | 0 refills | Status: DC

## 2020-06-29 NOTE — Progress Notes (Unsigned)
TeleHealth Visit:  Due to the COVID-19 pandemic, this visit was completed with telemedicine (audio/video) technology to reduce patient and provider exposure as well as to preserve personal protective equipment.   Sara Stevenson has verbally consented to this TeleHealth visit. The patient is located at home, the provider is located at the [facility] office. The participants in this visit include the listed provider and patient. The visit was conducted today via telephone.  Sara Stevenson was unable to use realtime audiovisual technology today and the telehealth visit was conducted via telephone.  Chief Complaint: OBESITY Sara Stevenson is here to discuss her progress with her obesity treatment plan along with follow-up of her obesity related diagnoses. Sara Stevenson is on the Category 2 Plan and states she is following her eating plan approximately 90% of the time. Sara Stevenson states she is not exercising regularly at this time.  Today's visit was #: 2 Starting weight: 289 lbs Starting date: 06/13/2020  Interim History: Sara Stevenson states that she has lost 2.5 pounds.  She says that it has been a struggle.  Subjective:   1. Vitamin D deficiency Sara Stevenson's Vitamin D level was 5.9 on 06/13/2020. She is currently taking no vitamin D supplement.   2. Vitamin B 12 deficiency Sara Stevenson started taking B12 in the interval between visits.   Lab Results  Component Value Date   VITAMINB12 212 (L) 06/13/2020   3. Prediabetes Sara Stevenson has a diagnosis of prediabetes based on her elevated HgA1c and was informed this puts her at greater risk of developing diabetes. She continues to work on diet and exercise to decrease her risk of diabetes. She denies nausea or hypoglycemia.  Lab Results  Component Value Date   HGBA1C 6.0 (H) 05/02/2020   Lab Results  Component Value Date   INSULIN 32.5 (H) 06/13/2020   Assessment/Plan:   1. Vitamin D deficiency Low Vitamin D level contributes to fatigue and are associated with obesity,  breast, and colon cancer. She agrees to start to take prescription Vitamin D @50 ,000 IU every week and will follow-up for routine testing of Vitamin D, at least 2-3 times per year to avoid over-replacement.  Prescription was sent to her pharmacy today.  2. Vitamin B 12 deficiency The diagnosis was reviewed with the patient. Counseling provided today, see below. We will continue to monitor. Orders and follow up as documented in patient record.  She will continue to take OTC vitamin B12 1000 mcg daily.  Counseling . The body needs vitamin B12: to make red blood cells; to make DNA; and to help the nerves work properly so they can carry messages from the brain to the body.  . The main causes of vitamin B12 deficiency include dietary deficiency, digestive diseases, pernicious anemia, and having a surgery in which part of the stomach or small intestine is removed.  . Certain medicines can make it harder for the body to absorb vitamin B12. These medicines include: heartburn medications; some antibiotics; some medications used to treat diabetes, gout, and high cholesterol.  . In some cases, there are no symptoms of this condition. If the condition leads to anemia or nerve damage, various symptoms can occur, such as weakness or fatigue, shortness of breath, and numbness or tingling in your hands and feet.   . Treatment:  o May include taking vitamin B12 supplements.  o Avoid alcohol.  o Eat lots of healthy foods that contain vitamin B12: - Beef, pork, chicken, , and organ meats, such as liver.  - Seafood: This includes  clams, rainbow trout, salmon, tuna, and haddock. Eggs.  - Cereal and dairy products that are fortified: This means that vitamin B12 has been added to the food.   3. Prediabetes Sara Stevenson will continue to work on weight loss, exercise, and decreasing simple carbohydrates to help decrease the risk of diabetes.  Decrease carbohydrates and increase protein and healthy fats.  4. Class 3  severe obesity with serious comorbidity and body mass index (BMI) of 45.0 to 49.9 in adult, unspecified obesity type (HCC)  Sara Stevenson is currently in the action stage of change. As such, her goal is to continue with weight loss efforts. She has agreed to the Category 2 Plan.   Will send in dinner and lunch options.  She will work on meal planning, increasing her water intake.  Reviewed labs including vitamin D, vitamin B12, and insulin level.  Exercise goals: For substantial health benefits, adults should do at least 150 minutes (2 hours and 30 minutes) a week of moderate-intensity, or 75 minutes (1 hour and 15 minutes) a week of vigorous-intensity aerobic physical activity, or an equivalent combination of moderate- and vigorous-intensity aerobic activity. Aerobic activity should be performed in episodes of at least 10 minutes, and preferably, it should be spread throughout the week.  Behavioral modification strategies: increasing lean protein intake, decreasing simple carbohydrates, increasing vegetables, increasing water intake, decreasing eating out, no skipping meals, meal planning and cooking strategies, keeping healthy foods in the home and planning for success.  Sara Stevenson has agreed to follow-up with our clinic in 2 weeks. She was informed of the importance of frequent follow-up visits to maximize her success with intensive lifestyle modifications for her multiple health conditions.  Objective:   VITALS: Per patient if applicable, see vitals. GENERAL: Alert and in no acute distress. CARDIOPULMONARY: No increased WOB. Speaking in clear sentences.  PSYCH: Pleasant and cooperative. Speech normal rate and rhythm. Affect is appropriate. Insight and judgement are appropriate. Attention is focused, linear, and appropriate.  NEURO: Oriented as arrived to appointment on time with no prompting.   Lab Results  Component Value Date   CREATININE 0.97 05/02/2020   BUN 10 05/02/2020   NA 140 05/02/2020    K 4.2 05/02/2020   CL 107 (H) 05/02/2020   CO2 20 05/02/2020   Lab Results  Component Value Date   ALT 7 05/02/2020   AST 11 05/02/2020   ALKPHOS 115 05/02/2020   BILITOT 0.2 05/02/2020   Lab Results  Component Value Date   HGBA1C 6.0 (H) 05/02/2020   HGBA1C 6.1 (H) 03/27/2019   HGBA1C 5.9 (H) 03/14/2018   HGBA1C 5.7 (H) 02/01/2017   Lab Results  Component Value Date   INSULIN 32.5 (H) 06/13/2020   Lab Results  Component Value Date   TSH 2.030 05/02/2020   Lab Results  Component Value Date   CHOL 133 05/02/2020   HDL 43 05/02/2020   LDLCALC 74 05/02/2020   TRIG 79 05/02/2020   CHOLHDL 3.1 05/02/2020   Lab Results  Component Value Date   WBC 9.0 05/02/2020   HGB 11.5 05/02/2020   HCT 36.4 05/02/2020   MCV 81 05/02/2020   Attestation Statements:   Reviewed by clinician on day of visit: allergies, medications, problem list, medical history, surgical history, family history, social history, and previous encounter notes.  Time spent on visit including pre-visit chart review and post-visit charting and care was 30 minutes.   I, Insurance claims handler, CMA, am acting as Energy manager for [employer], DO  I have  reviewed the above documentation for accuracy and completeness, and I agree with the above. Jearld Lesch, DO

## 2020-06-30 ENCOUNTER — Encounter (INDEPENDENT_AMBULATORY_CARE_PROVIDER_SITE_OTHER): Payer: Self-pay | Admitting: Bariatrics

## 2020-07-12 ENCOUNTER — Other Ambulatory Visit: Payer: Self-pay

## 2020-07-12 ENCOUNTER — Ambulatory Visit (INDEPENDENT_AMBULATORY_CARE_PROVIDER_SITE_OTHER): Payer: BC Managed Care – PPO | Admitting: Bariatrics

## 2020-07-12 ENCOUNTER — Encounter (INDEPENDENT_AMBULATORY_CARE_PROVIDER_SITE_OTHER): Payer: Self-pay | Admitting: Bariatrics

## 2020-07-12 VITALS — BP 127/84 | HR 84 | Temp 98.6°F | Ht 65.0 in | Wt 284.0 lb

## 2020-07-12 DIAGNOSIS — R7303 Prediabetes: Secondary | ICD-10-CM

## 2020-07-12 DIAGNOSIS — Z9189 Other specified personal risk factors, not elsewhere classified: Secondary | ICD-10-CM

## 2020-07-12 DIAGNOSIS — E559 Vitamin D deficiency, unspecified: Secondary | ICD-10-CM | POA: Diagnosis not present

## 2020-07-12 DIAGNOSIS — Z6841 Body Mass Index (BMI) 40.0 and over, adult: Secondary | ICD-10-CM | POA: Diagnosis not present

## 2020-07-12 MED ORDER — PHENTERMINE-TOPIRAMATE ER 7.5-46 MG PO CP24: ORAL_CAPSULE | ORAL | 0 refills | Status: DC

## 2020-07-13 ENCOUNTER — Encounter (INDEPENDENT_AMBULATORY_CARE_PROVIDER_SITE_OTHER): Payer: Self-pay | Admitting: Bariatrics

## 2020-07-13 NOTE — Progress Notes (Signed)
Chief Complaint:   OBESITY Sara Stevenson is here to discuss her progress with her obesity treatment plan along with follow-up of her obesity related diagnoses. Sara Stevenson is on the Category 2 Plan and states she is following her eating plan approximately 75% of the time. Sara Stevenson states she is not exercising at this time.  Today's visit was #: 3 Starting weight: 289 lbs Starting date: 06/13/2020 Today's weight: 284 lbs Today's date: 07/12/2020 Total lbs lost to date: 5 lbs Total lbs lost since last in-office visit: 5 lbs  Interim History: Sara Stevenson is down an additional 5 pounds since her last visit.  She has been drinking more water.  She is having cravings and some hunger and wants to try a medication.  Subjective:   1. Prediabetes Sara Stevenson has a diagnosis of prediabetes based on her elevated HgA1c and was informed this puts her at greater risk of developing diabetes. She continues to work on diet and exercise to decrease her risk of diabetes. She denies nausea or hypoglycemia.  She is on no medications for this.  Lab Results  Component Value Date   HGBA1C 6.0 (H) 05/02/2020   Lab Results  Component Value Date   INSULIN 32.5 (H) 06/13/2020   2. Vitamin D deficiency Sara Stevenson Vitamin D level was 5.9 on 06/13/2020. She is currently taking prescription vitamin D 50,000 IU each week. She denies nausea, vomiting or muscle weakness.  3. At risk for hypoglycemia Sara Stevenson is at increased risk for hypoglycemia due to her diagnosis of prediabetes.  Assessment/Plan:   1. Prediabetes Sara Stevenson will continue to work on weight loss, exercise, and decreasing simple carbohydrates to help decrease the risk of diabetes.  Continue decreasing carbohydrates and increasing healthy fats and protein.  Increase exercise.  2. Vitamin D deficiency Low Vitamin D level contributes to fatigue and are associated with obesity, breast, and colon cancer. She agrees to continue to take prescription Vitamin D @50 ,000 IU every week  and will follow-up for routine testing of Vitamin D, at least 2-3 times per year to avoid over-replacement.  3. At risk for hypoglycemia was given approximately 15 minutes of counseling today regarding prevention of hypoglycemia. She was advised of symptoms of hypoglycemia. Sara Stevenson was instructed to avoid skipping meals, eat regular protein rich meals and schedule low calorie snacks as needed.   Repetitive spaced learning was employed today to elicit superior memory formation and behavioral change  4. Class 3 severe obesity with serious comorbidity and body mass index (BMI) of 45.0 to 49.9 in adult, unspecified obesity type (HCC)  - Start Phentermine-Topiramate 7.5-46 MG CP24; 1 capsule daily  Dispense: 30 capsule; Refill: 0  Sara Stevenson is currently in the action stage of change. As such, her goal is to continue with weight loss efforts. She has agreed to the Category 2 Plan.   She will work on meal planning, intentional eating, increasing water intake, and increasing fiber and vegetables in her diet.  Recipes II sheet provided today.  Start Qsymia 7.5-46 mg daily.  Prescription sent to pharmacy.  Exercise goals: Active around the house and will get more active.  Behavioral modification strategies: increasing lean protein intake, decreasing simple carbohydrates, increasing vegetables, increasing water intake, decreasing eating out, no skipping meals, meal planning and cooking strategies, keeping healthy foods in the home and planning for success.  Sara Stevenson has agreed to follow-up with our clinic in 2 weeks. She was informed of the importance of frequent follow-up visits to maximize her success with intensive lifestyle  modifications for her multiple health conditions.   Objective:   Blood pressure 127/84, pulse 84, temperature 98.6 F (37 C), height 5\' 5"  (1.651 m), last menstrual period 07/04/2020, SpO2 98 %. Body mass index is 48.09 kg/m.  General: Cooperative, alert, well developed, in  no acute distress. HEENT: Conjunctivae and lids unremarkable. Cardiovascular: Regular rhythm.  Lungs: Normal work of breathing. Neurologic: No focal deficits.   Lab Results  Component Value Date   CREATININE 0.97 05/02/2020   BUN 10 05/02/2020   NA 140 05/02/2020   K 4.2 05/02/2020   CL 107 (H) 05/02/2020   CO2 20 05/02/2020   Lab Results  Component Value Date   ALT 7 05/02/2020   AST 11 05/02/2020   ALKPHOS 115 05/02/2020   BILITOT 0.2 05/02/2020   Lab Results  Component Value Date   HGBA1C 6.0 (H) 05/02/2020   HGBA1C 6.1 (H) 03/27/2019   HGBA1C 5.9 (H) 03/14/2018   HGBA1C 5.7 (H) 02/01/2017   Lab Results  Component Value Date   INSULIN 32.5 (H) 06/13/2020   Lab Results  Component Value Date   TSH 2.030 05/02/2020   Lab Results  Component Value Date   CHOL 133 05/02/2020   HDL 43 05/02/2020   LDLCALC 74 05/02/2020   TRIG 79 05/02/2020   CHOLHDL 3.1 05/02/2020   Lab Results  Component Value Date   WBC 9.0 05/02/2020   HGB 11.5 05/02/2020   HCT 36.4 05/02/2020   MCV 81 05/02/2020   Attestation Statements:   Reviewed by clinician on day of visit: allergies, medications, problem list, medical history, surgical history, family history, social history, and previous encounter notes.  Time spent on visit including pre-visit chart review and post-visit care and charting was 20 minutes.   I, 05/04/2020, CMA, am acting as Insurance claims handler for Energy manager, DO  I have reviewed the above documentation for accuracy and completeness, and I agree with the above. [employer], DO

## 2020-07-15 ENCOUNTER — Encounter (INDEPENDENT_AMBULATORY_CARE_PROVIDER_SITE_OTHER): Payer: Self-pay | Admitting: Bariatrics

## 2020-07-18 NOTE — Telephone Encounter (Signed)
Last OV with Dr Brown 

## 2020-07-26 ENCOUNTER — Ambulatory Visit (INDEPENDENT_AMBULATORY_CARE_PROVIDER_SITE_OTHER): Payer: BC Managed Care – PPO | Admitting: Bariatrics

## 2020-07-30 ENCOUNTER — Other Ambulatory Visit (INDEPENDENT_AMBULATORY_CARE_PROVIDER_SITE_OTHER): Payer: Self-pay | Admitting: Bariatrics

## 2020-07-30 DIAGNOSIS — E559 Vitamin D deficiency, unspecified: Secondary | ICD-10-CM

## 2020-08-01 NOTE — Telephone Encounter (Signed)
Last seen by Dr. Brown. 

## 2020-08-04 ENCOUNTER — Other Ambulatory Visit: Payer: Self-pay

## 2020-08-04 ENCOUNTER — Encounter (INDEPENDENT_AMBULATORY_CARE_PROVIDER_SITE_OTHER): Payer: Self-pay | Admitting: Bariatrics

## 2020-08-04 ENCOUNTER — Ambulatory Visit (INDEPENDENT_AMBULATORY_CARE_PROVIDER_SITE_OTHER): Payer: BC Managed Care – PPO | Admitting: Bariatrics

## 2020-08-04 VITALS — BP 133/84 | HR 79 | Temp 98.1°F | Ht 65.0 in | Wt 279.0 lb

## 2020-08-04 DIAGNOSIS — Z6841 Body Mass Index (BMI) 40.0 and over, adult: Secondary | ICD-10-CM | POA: Diagnosis not present

## 2020-08-04 DIAGNOSIS — E559 Vitamin D deficiency, unspecified: Secondary | ICD-10-CM | POA: Diagnosis not present

## 2020-08-04 DIAGNOSIS — R7303 Prediabetes: Secondary | ICD-10-CM | POA: Diagnosis not present

## 2020-08-04 DIAGNOSIS — Z9189 Other specified personal risk factors, not elsewhere classified: Secondary | ICD-10-CM | POA: Diagnosis not present

## 2020-08-04 MED ORDER — VITAMIN D (ERGOCALCIFEROL) 1.25 MG (50000 UNIT) PO CAPS: 50000.0000 [IU] | ORAL_CAPSULE | ORAL | 0 refills | Status: DC

## 2020-08-08 ENCOUNTER — Encounter (INDEPENDENT_AMBULATORY_CARE_PROVIDER_SITE_OTHER): Payer: Self-pay | Admitting: Bariatrics

## 2020-08-08 NOTE — Progress Notes (Signed)
Chief Complaint:   OBESITY Sara Stevenson is here to discuss her progress with her obesity treatment plan along with follow-up of her obesity related diagnoses. Sara Stevenson is on the Category 2 Plan and states she is following her eating plan approximately 80% of the time. Sara Stevenson states she is playing volleyball for 2 hours 2 times per week.  Today's visit was #: 4 Starting weight: 289 lbs Starting date: 06/13/2020 Today's weight: 279 lbs Today's date: 08/04/2020 Total lbs lost to date: 10 Total lbs lost since last in-office visit: 5  Interim History: Sara Stevenson is down an additional 5 lbs since her last visit. She is taking Qysmia that she started at her last visit.  Subjective:   1. Pre-diabetes Sara Stevenson's last A1c was 6.0, and she is not currently on medications.  2. Vitamin D deficiency Sara Stevenson's last Vit D level was 5.9. She denies nausea, vomiting, or muscle weakness.   3. At risk for osteoporosis Sara Stevenson is at higher risk of osteopenia and osteoporosis due to Vitamin D deficiency.   Assessment/Plan:   1. Pre-diabetes Sara Stevenson will continue to work on weight loss, exercise, and decreasing simple carbohydrates to help decrease the risk of diabetes.   2. Vitamin D deficiency Low Vitamin D level contributes to fatigue and are associated with obesity, breast, and colon cancer. We will refill prescription Vitamin D for 1 month. Sara Stevenson will follow-up for routine testing of Vitamin D, at least 2-3 times per year to avoid over-replacement.  - Vitamin D, Ergocalciferol, (DRISDOL) 1.25 MG (50000 UNIT) CAPS capsule; Take 1 capsule (50,000 Units total) by mouth every 7 (seven) days.  Dispense: 4 capsule; Refill: 0  3. At risk for osteoporosis Sara Stevenson was given approximately 15 minutes of osteoporosis prevention counseling today. Sara Stevenson is at risk for osteopenia and osteoporosis due to her Vitamin D deficiency. She was encouraged to take her Vitamin D and follow her higher calcium diet and increase  strengthening exercise to help strengthen her bones and decrease her risk of osteopenia and osteoporosis.  Repetitive spaced learning was employed today to elicit superior memory formation and behavioral change.  4. Class 3 severe obesity due to excess calories without serious comorbidity with body mass index (BMI) of 45.0 to 49.9 in adult [facility]) Sara Stevenson is currently in the action stage of change. As such, her goal is to continue with weight loss efforts. She has agreed to the Category 2 Plan.   Mindful eating was discussed.  We will recheck fasting labs at her next visit.  Exercise goals: As is.  Behavioral modification strategies: increasing lean protein intake, decreasing simple carbohydrates, increasing vegetables, increasing water intake, decreasing eating out, no skipping meals, meal planning and cooking strategies, keeping healthy foods in the home and planning for success.  Sara Stevenson has agreed to follow-up with our clinic in 2 weeks. She was informed of the importance of frequent follow-up visits to maximize her success with intensive lifestyle modifications for her multiple health conditions.   Objective:   Blood pressure 133/84, pulse 79, temperature 98.1 F (36.7 C), height 5\' 5"  (1.651 m), weight 279 lb (126.6 kg), last menstrual period 07/28/2020, SpO2 97 %. Body mass index is 46.43 kg/m.  General: Cooperative, alert, well developed, in no acute distress. HEENT: Conjunctivae and lids unremarkable. Cardiovascular: Regular rhythm.  Lungs: Normal work of breathing. Neurologic: No focal deficits.   Lab Results  Component Value Date   CREATININE 0.97 05/02/2020   BUN 10 05/02/2020   NA 140 05/02/2020   K  4.2 05/02/2020   CL 107 (H) 05/02/2020   CO2 20 05/02/2020   Lab Results  Component Value Date   ALT 7 05/02/2020   AST 11 05/02/2020   ALKPHOS 115 05/02/2020   BILITOT 0.2 05/02/2020   Lab Results  Component Value Date   HGBA1C 6.0 (H) 05/02/2020   HGBA1C 6.1 (H)  03/27/2019   HGBA1C 5.9 (H) 03/14/2018   HGBA1C 5.7 (H) 02/01/2017   Lab Results  Component Value Date   INSULIN 32.5 (H) 06/13/2020   Lab Results  Component Value Date   TSH 2.030 05/02/2020   Lab Results  Component Value Date   CHOL 133 05/02/2020   HDL 43 05/02/2020   LDLCALC 74 05/02/2020   TRIG 79 05/02/2020   CHOLHDL 3.1 05/02/2020   Lab Results  Component Value Date   WBC 9.0 05/02/2020   HGB 11.5 05/02/2020   HCT 36.4 05/02/2020   MCV 81 05/02/2020   No results found for: IRON, TIBC, FERRITIN  Attestation Statements:   Reviewed by clinician on day of visit: allergies, medications, problem list, medical history, surgical history, family history, social history, and previous encounter notes.   Trude Mcburney, am acting as Energy manager for [employer], DO.  I have reviewed the above documentation for accuracy and completeness, and I agree with the above. Corinna Capra, DO

## 2020-08-20 ENCOUNTER — Other Ambulatory Visit: Payer: Self-pay | Admitting: Obstetrics and Gynecology

## 2020-08-20 DIAGNOSIS — Z3009 Encounter for other general counseling and advice on contraception: Secondary | ICD-10-CM

## 2020-08-22 ENCOUNTER — Other Ambulatory Visit: Payer: Self-pay

## 2020-08-22 ENCOUNTER — Encounter (INDEPENDENT_AMBULATORY_CARE_PROVIDER_SITE_OTHER): Payer: Self-pay | Admitting: Physician Assistant

## 2020-08-22 ENCOUNTER — Ambulatory Visit (INDEPENDENT_AMBULATORY_CARE_PROVIDER_SITE_OTHER): Payer: BC Managed Care – PPO | Admitting: Physician Assistant

## 2020-08-22 VITALS — BP 137/78 | HR 74 | Temp 98.2°F | Ht 65.0 in | Wt 271.0 lb

## 2020-08-22 DIAGNOSIS — E559 Vitamin D deficiency, unspecified: Secondary | ICD-10-CM

## 2020-08-22 DIAGNOSIS — Z6841 Body Mass Index (BMI) 40.0 and over, adult: Secondary | ICD-10-CM

## 2020-08-22 DIAGNOSIS — R7303 Prediabetes: Secondary | ICD-10-CM

## 2020-08-24 NOTE — Progress Notes (Signed)
Chief Complaint:   OBESITY Sara Stevenson is here to discuss her progress with her obesity treatment plan along with follow-up of her obesity related diagnoses. Sara Stevenson is on the Category 2 Plan and states she is following her eating plan approximately 75% of the time. Sara Stevenson states she is playing [place]ed [place] ball 2 times per week.  Today's visit was #: 5 Starting weight: 289 lbs Starting date: 06/13/2020 Today's weight: 271 lbs Today's date: 08/22/2020 Total lbs lost to date: 18 Total lbs lost since last in-office visit: 8  Interim History: Sara Stevenson is drinking a protein shake for breakfast, having chicken and spinach for lunch, and Special K cereal with fruit for dinner. She is not weighing her protein.  Subjective:   1. Pre-diabetes Sara Stevenson is not on medications. Last A1c was 5.9, and she reports polyphagia that is reduced with Qsymia.  2. Vitamin D deficiency Sara Stevenson is on Vit D weekly, and she is tolerating it well.  Assessment/Plan:   1. Pre-diabetes Sara Stevenson will continue her meal plan, and will continue to work on weight loss, exercise, and decreasing simple carbohydrates to help decrease the risk of diabetes.   2. Vitamin D deficiency Low Vitamin D level contributes to fatigue and are associated with obesity, breast, and colon cancer. Sara Stevenson agreed to continue taking prescription Vitamin D 50,000 IU every week and will follow-up for routine testing of Vitamin D, at least 2-3 times per year to avoid over-replacement.  3. Class 3 severe obesity due to excess calories without serious comorbidity with body mass index (BMI) of 45.0 to 49.9 in adult [facility]) Sara Stevenson is currently in the action stage of change. As such, her goal is to continue with weight loss efforts. She has agreed to the Category 2 Plan.   Exercise goals: As is.  Behavioral modification strategies: increasing lean protein intake and meal planning and cooking strategies.  Sara Stevenson has agreed to follow-up with our clinic in  2 weeks. She was informed of the importance of frequent follow-up visits to maximize her success with intensive lifestyle modifications for her multiple health conditions.   Objective:   Blood pressure 137/78, pulse 74, temperature 98.2 F (36.8 C), height 5\' 5"  (1.651 m), weight 271 lb (122.9 kg), last menstrual period 07/28/2020, SpO2 96 %. Body mass index is 45.1 kg/m.  General: Cooperative, alert, well developed, in no acute distress. HEENT: Conjunctivae and lids unremarkable. Cardiovascular: Regular rhythm.  Lungs: Normal work of breathing. Neurologic: No focal deficits.   Lab Results  Component Value Date   CREATININE 0.97 05/02/2020   BUN 10 05/02/2020   NA 140 05/02/2020   K 4.2 05/02/2020   CL 107 (H) 05/02/2020   CO2 20 05/02/2020   Lab Results  Component Value Date   ALT 7 05/02/2020   AST 11 05/02/2020   ALKPHOS 115 05/02/2020   BILITOT 0.2 05/02/2020   Lab Results  Component Value Date   HGBA1C 6.0 (H) 05/02/2020   HGBA1C 6.1 (H) 03/27/2019   HGBA1C 5.9 (H) 03/14/2018   HGBA1C 5.7 (H) 02/01/2017   Lab Results  Component Value Date   INSULIN 32.5 (H) 06/13/2020   Lab Results  Component Value Date   TSH 2.030 05/02/2020   Lab Results  Component Value Date   CHOL 133 05/02/2020   HDL 43 05/02/2020   LDLCALC 74 05/02/2020   TRIG 79 05/02/2020   CHOLHDL 3.1 05/02/2020   Lab Results  Component Value Date   WBC 9.0 05/02/2020   HGB  11.5 05/02/2020   HCT 36.4 05/02/2020   MCV 81 05/02/2020   No results found for: IRON, TIBC, FERRITIN  Attestation Statements:   Reviewed by clinician on day of visit: allergies, medications, problem list, medical history, surgical history, family history, social history, and previous encounter notes.  Time spent on visit including pre-visit chart review and post-visit care and charting was 30 minutes.    Trude Mcburney, am acting as transcriptionist for [employer], PA-C.  I have reviewed the above  documentation for accuracy and completeness, and I agree with the above. Alois Cliche, PA-C

## 2020-08-30 ENCOUNTER — Encounter (INDEPENDENT_AMBULATORY_CARE_PROVIDER_SITE_OTHER): Payer: Self-pay

## 2020-09-08 ENCOUNTER — Ambulatory Visit (INDEPENDENT_AMBULATORY_CARE_PROVIDER_SITE_OTHER): Payer: BC Managed Care – PPO | Admitting: Bariatrics

## 2020-09-12 ENCOUNTER — Other Ambulatory Visit (INDEPENDENT_AMBULATORY_CARE_PROVIDER_SITE_OTHER): Payer: Self-pay | Admitting: Bariatrics

## 2020-09-12 DIAGNOSIS — E559 Vitamin D deficiency, unspecified: Secondary | ICD-10-CM

## 2020-09-15 ENCOUNTER — Other Ambulatory Visit: Payer: Self-pay

## 2020-09-15 ENCOUNTER — Ambulatory Visit (INDEPENDENT_AMBULATORY_CARE_PROVIDER_SITE_OTHER): Payer: BC Managed Care – PPO | Admitting: Bariatrics

## 2020-09-15 ENCOUNTER — Encounter (INDEPENDENT_AMBULATORY_CARE_PROVIDER_SITE_OTHER): Payer: Self-pay | Admitting: Bariatrics

## 2020-09-15 VITALS — BP 119/82 | HR 87 | Temp 98.4°F | Ht 65.0 in | Wt 271.0 lb

## 2020-09-15 DIAGNOSIS — Z9189 Other specified personal risk factors, not elsewhere classified: Secondary | ICD-10-CM | POA: Diagnosis not present

## 2020-09-15 DIAGNOSIS — Z6841 Body Mass Index (BMI) 40.0 and over, adult: Secondary | ICD-10-CM | POA: Diagnosis not present

## 2020-09-15 DIAGNOSIS — E559 Vitamin D deficiency, unspecified: Secondary | ICD-10-CM | POA: Diagnosis not present

## 2020-09-15 DIAGNOSIS — J302 Other seasonal allergic rhinitis: Secondary | ICD-10-CM

## 2020-09-15 MED ORDER — PHENTERMINE-TOPIRAMATE ER 7.5-46 MG PO CP24: ORAL_CAPSULE | ORAL | 0 refills | Status: DC

## 2020-09-15 MED ORDER — VITAMIN D (ERGOCALCIFEROL) 1.25 MG (50000 UNIT) PO CAPS: 50000.0000 [IU] | ORAL_CAPSULE | ORAL | 0 refills | Status: DC

## 2020-09-19 NOTE — Progress Notes (Signed)
Chief Complaint:   OBESITY Sara Stevenson is here to discuss her progress with her obesity treatment plan along with follow-up of her obesity related diagnoses. Sara Stevenson is on the Category 2 Plan and states she is following her eating plan approximately 65% of the time. Sara Stevenson states she is playing volleyball for 2 hours 2 times per week.  Today's visit was #: 6 Starting weight: 289 lbs Starting date: 06/13/2020 Today's weight: 271 lbs Today's date: 09/15/2020 Total lbs lost to date: 18 lbs Total lbs lost since last in-office visit: 0  Interim History: Sara Stevenson's weight remains the same.  She is taking Qsymia.  Subjective:   1. Vitamin D deficiency Sara Stevenson's Vitamin D level was 5.9 on 06/13/2020. She is currently taking prescription vitamin D 50,000 IU each week. She denies nausea, vomiting or muscle weakness.  She gets minimal sun exposure.  2. Seasonal allergies Sara Stevenson is taking Zyrtec.   3. At risk for osteoporosis Sara Stevenson is at higher risk of osteopenia and osteoporosis due to Vitamin D deficiency.   Assessment/Plan:   1. Vitamin D deficiency Low Vitamin D level contributes to fatigue and are associated with obesity, breast, and colon cancer. She agrees to continue to take prescription Vitamin D @50 ,000 IU every week and will follow-up for routine testing of Vitamin D, at least 2-3 times per year to avoid over-replacement.  - Refill Vitamin D, Ergocalciferol, (DRISDOL) 1.25 MG (50000 UNIT) CAPS capsule; Take 1 capsule (50,000 Units total) by mouth every 7 (seven) days.  Dispense: 4 capsule; Refill: 0  2. Seasonal allergies Continue Zyrtec.  3. At risk for osteoporosis was given approximately 15 minutes of osteoporosis prevention counseling today. Sara Stevenson is at risk for osteopenia and osteoporosis due to her Vitamin D deficiency. She was encouraged to take her Vitamin D and follow her higher calcium diet and increase strengthening exercise to help strengthen her bones and decrease  her risk of osteopenia and osteoporosis.  Repetitive spaced learning was employed today to elicit superior memory formation and behavioral change.  4. Obesity, current BMI 45  - Refill Phentermine-Topiramate 7.5-46 MG CP24; 1 capsule daily  Dispense: 30 capsule; Refill: 0  Sara Stevenson is currently in the action stage of change. As such, her goal is to continue with weight loss efforts. She has agreed to the Category 2 Plan.   She will work on remaining strictly adherent to the plan.  Will refill Qsymia 7.5-46 mg daily.   Exercise goals: As is.  Behavioral modification strategies: increasing lean protein intake, decreasing simple carbohydrates, increasing vegetables, increasing water intake, decreasing eating out, no skipping meals, meal planning and cooking strategies, keeping healthy foods in the home and planning for success.  Sara Stevenson has agreed to follow-up with our clinic in 2 weeks, fasting. She was informed of the importance of frequent follow-up visits to maximize her success with intensive lifestyle modifications for her multiple health conditions.   Objective:   Blood pressure 119/82, pulse 87, temperature 98.4 F (36.9 C), height 5\' 5"  (1.651 m), weight 271 lb (122.9 kg), SpO2 98 %. Body mass index is 45.1 kg/m.  General: Cooperative, alert, well developed, in no acute distress. HEENT: Conjunctivae and lids unremarkable. Cardiovascular: Regular rhythm.  Lungs: Normal work of breathing. Neurologic: No focal deficits.   Lab Results  Component Value Date   CREATININE 0.97 05/02/2020   BUN 10 05/02/2020   NA 140 05/02/2020   K 4.2 05/02/2020   CL 107 (H) 05/02/2020   CO2 20 05/02/2020  Lab Results  Component Value Date   ALT 7 05/02/2020   AST 11 05/02/2020   ALKPHOS 115 05/02/2020   BILITOT 0.2 05/02/2020   Lab Results  Component Value Date   HGBA1C 6.0 (H) 05/02/2020   HGBA1C 6.1 (H) 03/27/2019   HGBA1C 5.9 (H) 03/14/2018   HGBA1C 5.7 (H) 02/01/2017   Lab  Results  Component Value Date   INSULIN 32.5 (H) 06/13/2020   Lab Results  Component Value Date   TSH 2.030 05/02/2020   Lab Results  Component Value Date   CHOL 133 05/02/2020   HDL 43 05/02/2020   LDLCALC 74 05/02/2020   TRIG 79 05/02/2020   CHOLHDL 3.1 05/02/2020   Lab Results  Component Value Date   WBC 9.0 05/02/2020   HGB 11.5 05/02/2020   HCT 36.4 05/02/2020   MCV 81 05/02/2020   Attestation Statements:   Reviewed by clinician on day of visit: allergies, medications, problem list, medical history, surgical history, family history, social history, and previous encounter notes.  I, Insurance claims handler, CMA, am acting as Energy manager for [employer], DO  I have reviewed the above documentation for accuracy and completeness, and I agree with the above. Corinna Capra, DO

## 2020-09-20 ENCOUNTER — Encounter (INDEPENDENT_AMBULATORY_CARE_PROVIDER_SITE_OTHER): Payer: Self-pay | Admitting: Bariatrics

## 2020-09-26 DIAGNOSIS — H5213 Myopia, bilateral: Secondary | ICD-10-CM | POA: Diagnosis not present

## 2020-10-10 ENCOUNTER — Other Ambulatory Visit: Payer: Self-pay

## 2020-10-10 ENCOUNTER — Encounter (INDEPENDENT_AMBULATORY_CARE_PROVIDER_SITE_OTHER): Payer: Self-pay | Admitting: Bariatrics

## 2020-10-10 ENCOUNTER — Ambulatory Visit (INDEPENDENT_AMBULATORY_CARE_PROVIDER_SITE_OTHER): Payer: BC Managed Care – PPO | Admitting: Bariatrics

## 2020-10-10 VITALS — BP 126/72 | HR 80 | Temp 98.0°F | Ht 65.0 in | Wt 268.0 lb

## 2020-10-10 DIAGNOSIS — E559 Vitamin D deficiency, unspecified: Secondary | ICD-10-CM

## 2020-10-10 DIAGNOSIS — R7303 Prediabetes: Secondary | ICD-10-CM

## 2020-10-10 DIAGNOSIS — Z6841 Body Mass Index (BMI) 40.0 and over, adult: Secondary | ICD-10-CM

## 2020-10-10 DIAGNOSIS — Z9189 Other specified personal risk factors, not elsewhere classified: Secondary | ICD-10-CM

## 2020-10-10 DIAGNOSIS — E538 Deficiency of other specified B group vitamins: Secondary | ICD-10-CM | POA: Diagnosis not present

## 2020-10-10 MED ORDER — VITAMIN D (ERGOCALCIFEROL) 1.25 MG (50000 UNIT) PO CAPS: 50000.0000 [IU] | ORAL_CAPSULE | ORAL | 0 refills | Status: DC

## 2020-10-11 ENCOUNTER — Encounter (INDEPENDENT_AMBULATORY_CARE_PROVIDER_SITE_OTHER): Payer: Self-pay | Admitting: Bariatrics

## 2020-10-11 LAB — HEMOGLOBIN A1C
Est. average glucose Bld gHb Est-mCnc: 126 mg/dL
Hgb A1c MFr Bld: 6 % — ABNORMAL HIGH (ref 4.8–5.6)

## 2020-10-11 LAB — INSULIN, RANDOM: INSULIN: 26.7 u[IU]/mL — ABNORMAL HIGH (ref 2.6–24.9)

## 2020-10-11 LAB — VITAMIN B12: Vitamin B-12: 359 pg/mL (ref 232–1245)

## 2020-10-11 LAB — VITAMIN D 25 HYDROXY (VIT D DEFICIENCY, FRACTURES): Vit D, 25-Hydroxy: 28.6 ng/mL — ABNORMAL LOW (ref 30.0–100.0)

## 2020-10-11 NOTE — Progress Notes (Signed)
Chief Complaint:   OBESITY Sara Stevenson is here to discuss her progress with her obesity treatment plan along with follow-up of her obesity related diagnoses. Sara Stevenson is on the Category 2 Plan and states she is following her eating plan approximately 70% of the time. Sara Stevenson states she is volleyball 90 minutes 2 times per week.  Today's visit was #: 7 Starting weight: 289 lbs Starting date: 06/13/2020 Today's weight: 268 lbs Today's date: 10/10/2020 Total lbs lost to date: 21 Total lbs lost since last in-office visit: 3  Interim History: Sara Stevenson is down an additional 3 lbs. She is taking Qsymia with problems. She is doing better with her vegetables.  Subjective:   1. Vitamin D deficiency Pt denies nausea, vomiting, and muscle weakness. Pt is on prescription Vit D.  2. Pre-diabetes Sara Stevenson is not on medication. She reports normal appetite.  3. Vitamin B 12 deficiency Sara Stevenson is taking OTC B12 1,000 mcg daily.  4. At risk for osteoporosis Sara Stevenson is at higher risk of osteopenia and osteoporosis due to Vitamin D deficiency.   Assessment/Plan:   1. Vitamin D deficiency Low Vitamin D level contributes to fatigue and are associated with obesity, breast, and colon cancer. She agrees to continue to take prescription Vitamin D @50 ,000 IU every week and will follow-up for routine testing of Vitamin D, at least 2-3 times per year to avoid over-replacement. Check labs today.  - Vitamin D, Ergocalciferol, (DRISDOL) 1.25 MG (50000 UNIT) CAPS capsule; Take 1 capsule (50,000 Units total) by mouth every 7 (seven) days.  Dispense: 4 capsule; Refill: 0  - VITAMIN D 25 Hydroxy (Vit-D Deficiency, Fractures)  2. Pre-diabetes Sara Stevenson will continue to work on weight loss, exercise, and decreasing simple carbohydrates to help decrease the risk of diabetes. Check labs today.  - Hemoglobin A1c - Insulin, random  3. Vitamin B 12 deficiency The diagnosis was reviewed with the patient. Counseling provided  today, see below. We will continue to monitor. Orders and follow up as documented in patient record. Check labs today.  Counseling . The body needs vitamin B12: to make red blood cells; to make DNA; and to help the nerves work properly so they can carry messages from the brain to the body.  . The main causes of vitamin B12 deficiency include dietary deficiency, digestive diseases, pernicious anemia, and having a surgery in which part of the stomach or small intestine is removed.  . Certain medicines can make it harder for the body to absorb vitamin B12. These medicines include: heartburn medications; some antibiotics; some medications used to treat diabetes, gout, and high cholesterol.  . In some cases, there are no symptoms of this condition. If the condition leads to anemia or nerve damage, various symptoms can occur, such as weakness or fatigue, shortness of breath, and numbness or tingling in your hands and feet.   . Treatment:  o May include taking vitamin B12 supplements.  o Avoid alcohol.  o Eat lots of healthy foods that contain vitamin B12: - Beef, pork, chicken, [place], and organ meats, such as liver.  - Seafood: This includes clams, rainbow trout, salmon, tuna, and haddock. Eggs.  - Cereal and dairy products that are fortified: This means that vitamin B12 has been added to the food.   - Vitamin B12  4. At risk for osteoporosis Sara Stevenson was given approximately 15 minutes of osteoporosis prevention counseling today. Sara Stevenson is at risk for osteopenia and osteoporosis due to her Vitamin D deficiency. She was encouraged to  take her Vitamin D and follow her higher calcium diet and increase strengthening exercise to help strengthen her bones and decrease her risk of osteopenia and osteoporosis.  Repetitive spaced learning was employed today to elicit superior memory formation and behavioral change.  5. Obesity, current BMI 51  Sara Stevenson is currently in the action stage of change. As such, her  goal is to continue with weight loss efforts. She has agreed to the Category 2 Plan.   Meal plan Intentional eating  Exercise goals: Volleyball and will walk after work.  Behavioral modification strategies: increasing lean protein intake, decreasing simple carbohydrates, increasing vegetables, increasing water intake, decreasing eating out, no skipping meals, meal planning and cooking strategies, keeping healthy foods in the home and planning for success.  Sara Stevenson has agreed to follow-up with our clinic in 2 weeks. She was informed of the importance of frequent follow-up visits to maximize her success with intensive lifestyle modifications for her multiple health conditions.   Sara Stevenson was informed we would discuss her lab results at her next visit unless there is a critical issue that needs to be addressed sooner. Sara Stevenson agreed to keep her next visit at the agreed upon time to discuss these results.  Objective:   Blood pressure 126/72, pulse 80, temperature 98 F (36.7 C), height 5\' 5"  (1.651 m), weight 268 lb (121.6 kg), last menstrual period 09/21/2020, SpO2 98 %. Body mass index is 44.6 kg/m.  General: Cooperative, alert, well developed, in no acute distress. HEENT: Conjunctivae and lids unremarkable. Cardiovascular: Regular rhythm.  Lungs: Normal work of breathing. Neurologic: No focal deficits.   Lab Results  Component Value Date   CREATININE 0.97 05/02/2020   BUN 10 05/02/2020   NA 140 05/02/2020   K 4.2 05/02/2020   CL 107 (H) 05/02/2020   CO2 20 05/02/2020   Lab Results  Component Value Date   ALT 7 05/02/2020   AST 11 05/02/2020   ALKPHOS 115 05/02/2020   BILITOT 0.2 05/02/2020   Lab Results  Component Value Date   HGBA1C 6.0 (H) 10/10/2020   HGBA1C 6.0 (H) 05/02/2020   HGBA1C 6.1 (H) 03/27/2019   HGBA1C 5.9 (H) 03/14/2018   HGBA1C 5.7 (H) 02/01/2017   Lab Results  Component Value Date   INSULIN 26.7 (H) 10/10/2020   INSULIN 32.5 (H) 06/13/2020   Lab  Results  Component Value Date   TSH 2.030 05/02/2020   Lab Results  Component Value Date   CHOL 133 05/02/2020   HDL 43 05/02/2020   LDLCALC 74 05/02/2020   TRIG 79 05/02/2020   CHOLHDL 3.1 05/02/2020   Lab Results  Component Value Date   WBC 9.0 05/02/2020   HGB 11.5 05/02/2020   HCT 36.4 05/02/2020   MCV 81 05/02/2020    Attestation Statements:   Reviewed by clinician on day of visit: allergies, medications, problem list, medical history, surgical history, family history, social history, and previous encounter notes.  05/04/2020, am acting as Edmund Hilda for Energy manager, DO.  I have reviewed the above documentation for accuracy and completeness, and I agree with the above. [employer], DO

## 2020-10-14 ENCOUNTER — Other Ambulatory Visit (INDEPENDENT_AMBULATORY_CARE_PROVIDER_SITE_OTHER): Payer: Self-pay | Admitting: Bariatrics

## 2020-10-14 DIAGNOSIS — E559 Vitamin D deficiency, unspecified: Secondary | ICD-10-CM

## 2020-10-17 NOTE — Telephone Encounter (Signed)
Pt last seen by Dr. Brown.  

## 2020-10-19 DIAGNOSIS — Z113 Encounter for screening for infections with a predominantly sexual mode of transmission: Secondary | ICD-10-CM | POA: Diagnosis not present

## 2020-10-19 DIAGNOSIS — R102 Pelvic and perineal pain: Secondary | ICD-10-CM | POA: Diagnosis not present

## 2020-10-19 DIAGNOSIS — R8290 Unspecified abnormal findings in urine: Secondary | ICD-10-CM | POA: Diagnosis not present

## 2020-10-27 ENCOUNTER — Ambulatory Visit (INDEPENDENT_AMBULATORY_CARE_PROVIDER_SITE_OTHER): Payer: BC Managed Care – PPO | Admitting: Bariatrics

## 2020-10-27 ENCOUNTER — Encounter (INDEPENDENT_AMBULATORY_CARE_PROVIDER_SITE_OTHER): Payer: Self-pay | Admitting: Bariatrics

## 2020-10-27 ENCOUNTER — Other Ambulatory Visit: Payer: Self-pay

## 2020-10-27 VITALS — BP 122/80 | HR 80 | Temp 98.3°F | Ht 65.0 in | Wt 264.0 lb

## 2020-10-27 DIAGNOSIS — E559 Vitamin D deficiency, unspecified: Secondary | ICD-10-CM | POA: Diagnosis not present

## 2020-10-27 DIAGNOSIS — E538 Deficiency of other specified B group vitamins: Secondary | ICD-10-CM

## 2020-10-27 DIAGNOSIS — Z9189 Other specified personal risk factors, not elsewhere classified: Secondary | ICD-10-CM

## 2020-10-27 DIAGNOSIS — Z6841 Body Mass Index (BMI) 40.0 and over, adult: Secondary | ICD-10-CM

## 2020-10-27 DIAGNOSIS — R7303 Prediabetes: Secondary | ICD-10-CM

## 2020-10-27 MED ORDER — VITAMIN D (ERGOCALCIFEROL) 1.25 MG (50000 UNIT) PO CAPS: 50000.0000 [IU] | ORAL_CAPSULE | ORAL | 0 refills | Status: DC

## 2020-10-27 MED ORDER — PHENTERMINE-TOPIRAMATE ER 7.5-46 MG PO CP24: ORAL_CAPSULE | ORAL | 0 refills | Status: DC

## 2020-10-31 NOTE — Progress Notes (Signed)
Chief Complaint:   OBESITY Sara Stevenson is here to discuss her progress with her obesity treatment plan along with follow-up of her obesity related diagnoses. Mlissa is on the Category 2 Plan and states she is following her eating plan approximately 70% of the time. [name] states she is walking and playing volleyball 35-60 minutes 1-2 times per week.  Today's visit was #: 8 Starting weight: 289 lbs Starting date: 06/13/2020 Today's weight: 264 lbs Today's date: 10/27/2020 Total lbs lost to date: 25 lbs Total lbs lost since last in-office visit: 4  Interim History: Sherman is down an additional 4 lbs and doing well overall. She is doing well with protein and water. She is doing well with Qsymia.  Subjective:   1. Pre-diabetes Karena A1c is 6.0 and insulin level 26.7.  Lab Results  Component Value Date   HGBA1C 6.0 (H) 10/10/2020   Lab Results  Component Value Date   INSULIN 26.7 (H) 10/10/2020   INSULIN 32.5 (H) 06/13/2020    2. Vitamin B 12 deficiency [name] is taking OTC B12 supplement. She reports having more energy. B12 level 359- improving.  Lab Results  Component Value Date   VITAMINB12 359 10/10/2020    3. Vitamin D deficiency [name] denies nausea, vomiting, and muscle weakness. Her Vit D level is improving at 28.6 from 5.9.  4. At risk for diabetes mellitus [name] is at higher than average risk for developing diabetes due to obesity and pre-diabetes.  Assessment/Plan:   1. Pre-diabetes Lenna will continue to work on weight loss, exercise, and decreasing simple carbohydrates to help decrease the risk of diabetes.   2. Vitamin B 12 deficiency The diagnosis was reviewed with the patient. Counseling provided today, see below. We will continue to monitor. Orders and follow up as documented in patient record. Continue B12 supplement.  Counseling . The body needs vitamin B12: to make red blood cells; to make DNA; and to help the nerves work properly so they can  carry messages from the brain to the body.  . The main causes of vitamin B12 deficiency include dietary deficiency, digestive diseases, pernicious anemia, and having a surgery in which part of the stomach or small intestine is removed.  . Certain medicines can make it harder for the body to absorb vitamin B12. These medicines include: heartburn medications; some antibiotics; some medications used to treat diabetes, gout, and high cholesterol.  . In some cases, there are no symptoms of this condition. If the condition leads to anemia or nerve damage, various symptoms can occur, such as weakness or fatigue, shortness of breath, and numbness or tingling in your hands and feet.   . Treatment:  o May include taking vitamin B12 supplements.  o Avoid alcohol.  o Eat lots of healthy foods that contain vitamin B12: - Beef, pork, chicken, [place], and organ meats, such as liver.  - Seafood: This includes clams, rainbow trout, salmon, tuna, and haddock. Eggs.  - Cereal and dairy products that are fortified: This means that vitamin B12 has been added to the food.   3. Vitamin D deficiency Low Vitamin D level contributes to fatigue and are associated with obesity, breast, and colon cancer. She agrees to continue to take prescription Vitamin D @50 ,000 IU every week and will follow-up for routine testing of Vitamin D, at least 2-3 times per year to avoid over-replacement. - Vitamin D, Ergocalciferol, (DRISDOL) 1.25 MG (50000 UNIT) CAPS capsule; Take 1 capsule (50,000 Units total) by mouth every 7 (  seven) days.  Dispense: 4 capsule; Refill: 0  4. At risk for diabetes mellitus [name] was given approximately 15 minutes of diabetes education and counseling today. We discussed intensive lifestyle modifications today with an emphasis on weight loss as well as increasing exercise and decreasing simple carbohydrates in her diet. We also reviewed medication options with an emphasis on risk versus benefit of those discussed.    Repetitive spaced learning was employed today to elicit superior memory formation and behavioral change.  5. Obesity, current BMI 16 [name] is currently in the action stage of change. As such, her goal is to continue with weight loss efforts. She has agreed to the Category 2 Plan.   Meal plan Intentional eating 10/10/2020 labs reviewed We discussed various medication options to help [name] with her weight loss efforts and we both agreed to continue Qsymia, as prescribed below. - Phentermine-Topiramate 7.5-46 MG CP24; 1 capsule daily  Dispense: 30 capsule; Refill: 0  Exercise goals: As is  Behavioral modification strategies: increasing lean protein intake, decreasing simple carbohydrates, increasing vegetables, increasing water intake, decreasing eating out, no skipping meals, meal planning and cooking strategies, keeping healthy foods in the home and planning for success.  Shameeka has agreed to follow-up with our clinic in 2 weeks. She was informed of the importance of frequent follow-up visits to maximize her success with intensive lifestyle modifications for her multiple health conditions.   Objective:   Blood pressure 122/80, pulse 80, temperature 98.3 F (36.8 C), height 5\' 5"  (1.651 m), weight 264 lb (119.7 kg), SpO2 97 %. Body mass index is 43.93 kg/m.  General: Cooperative, alert, well developed, in no acute distress. HEENT: Conjunctivae and lids unremarkable. Cardiovascular: Regular rhythm.  Lungs: Normal work of breathing. Neurologic: No focal deficits.   Lab Results  Component Value Date   CREATININE 0.97 05/02/2020   BUN 10 05/02/2020   NA 140 05/02/2020   K 4.2 05/02/2020   CL 107 (H) 05/02/2020   CO2 20 05/02/2020   Lab Results  Component Value Date   ALT 7 05/02/2020   AST 11 05/02/2020   ALKPHOS 115 05/02/2020   BILITOT 0.2 05/02/2020   Lab Results  Component Value Date   HGBA1C 6.0 (H) 10/10/2020   HGBA1C 6.0 (H) 05/02/2020   HGBA1C 6.1 (H)  03/27/2019   HGBA1C 5.9 (H) 03/14/2018   HGBA1C 5.7 (H) 02/01/2017   Lab Results  Component Value Date   INSULIN 26.7 (H) 10/10/2020   INSULIN 32.5 (H) 06/13/2020   Lab Results  Component Value Date   TSH 2.030 05/02/2020   Lab Results  Component Value Date   CHOL 133 05/02/2020   HDL 43 05/02/2020   LDLCALC 74 05/02/2020   TRIG 79 05/02/2020   CHOLHDL 3.1 05/02/2020   Lab Results  Component Value Date   WBC 9.0 05/02/2020   HGB 11.5 05/02/2020   HCT 36.4 05/02/2020   MCV 81 05/02/2020   No results found for: IRON, TIBC, FERRITIN   Attestation Statements:   Reviewed by clinician on day of visit: allergies, medications, problem list, medical history, surgical history, family history, social history, and previous encounter notes.  05/04/2020, CMA, am acting as Edmund Hilda for Energy manager, DO.  I have reviewed the above documentation for accuracy and completeness, and I agree with the above. [employer], DO

## 2020-11-01 ENCOUNTER — Encounter (INDEPENDENT_AMBULATORY_CARE_PROVIDER_SITE_OTHER): Payer: Self-pay | Admitting: Bariatrics

## 2020-11-08 ENCOUNTER — Other Ambulatory Visit (INDEPENDENT_AMBULATORY_CARE_PROVIDER_SITE_OTHER): Payer: Self-pay | Admitting: Bariatrics

## 2020-11-08 DIAGNOSIS — E559 Vitamin D deficiency, unspecified: Secondary | ICD-10-CM

## 2020-11-21 ENCOUNTER — Other Ambulatory Visit: Payer: Self-pay

## 2020-11-21 ENCOUNTER — Ambulatory Visit (INDEPENDENT_AMBULATORY_CARE_PROVIDER_SITE_OTHER): Payer: BC Managed Care – PPO | Admitting: Bariatrics

## 2020-11-21 ENCOUNTER — Encounter (INDEPENDENT_AMBULATORY_CARE_PROVIDER_SITE_OTHER): Payer: Self-pay | Admitting: Bariatrics

## 2020-11-21 VITALS — BP 102/76 | HR 80 | Temp 98.4°F | Ht 65.0 in | Wt 258.0 lb

## 2020-11-21 DIAGNOSIS — R7303 Prediabetes: Secondary | ICD-10-CM | POA: Diagnosis not present

## 2020-11-21 DIAGNOSIS — Z6841 Body Mass Index (BMI) 40.0 and over, adult: Secondary | ICD-10-CM

## 2020-11-24 NOTE — Progress Notes (Signed)
Chief Complaint:   OBESITY Sara Stevenson is here to discuss her progress with her obesity treatment plan along with follow-up of her obesity related diagnoses. Sara Stevenson is on the Category 2 Plan and states she is following her eating plan approximately 65% of the time. Sara Stevenson states she is walking 30 minutes 2 times per week.  Today's visit was #: 9 Starting weight: 289 lbs Starting date: 06/13/2020 Today's weight: 258 lbs Today's date: 11/21/2020 Total lbs lost to date: 31 Total lbs lost since last in-office visit: 6  Interim History: Sara Stevenson is down an additional 6 lbs. She is taking Qsymia without side effects.  Subjective:   1. Pre-diabetes Sara Stevenson is not taking any medication.  Lab Results  Component Value Date   HGBA1C 6.0 (H) 10/10/2020   Lab Results  Component Value Date   INSULIN 26.7 (H) 10/10/2020   INSULIN 32.5 (H) 06/13/2020   Assessment/Plan:   1. Pre-diabetes Sara Stevenson will continue to work on weight loss, exercise, and decreasing simple carbohydrates to help decrease the risk of diabetes.   2. Obesity, current BMI 59  Sara Stevenson is currently in the action stage of change. As such, her goal is to continue with weight loss efforts. She has agreed to the Category 2 Plan.   Meal plan Intentional eating  Exercise goals:  Continue walking in the morning and using videos for exercise.  Behavioral modification strategies: increasing lean protein intake, decreasing simple carbohydrates, increasing vegetables, increasing water intake, decreasing eating out, no skipping meals, meal planning and cooking strategies, keeping healthy foods in the home, and planning for success.  Sara Stevenson has agreed to follow-up with our clinic in 2 weeks. She was informed of the importance of frequent follow-up visits to maximize her success with intensive lifestyle modifications for her multiple health conditions.   Objective:   Blood pressure 102/76, pulse 80, temperature 98.4 F (36.9 C),  height 5\' 5"  (1.651 m), weight 258 lb (117 kg), last menstrual period 11/14/2020, SpO2 99 %. Body mass index is 42.93 kg/m.  General: Cooperative, alert, well developed, in no acute distress. HEENT: Conjunctivae and lids unremarkable. Cardiovascular: Regular rhythm.  Lungs: Normal work of breathing. Neurologic: No focal deficits.   Lab Results  Component Value Date   CREATININE 0.97 05/02/2020   BUN 10 05/02/2020   NA 140 05/02/2020   K 4.2 05/02/2020   CL 107 (H) 05/02/2020   CO2 20 05/02/2020   Lab Results  Component Value Date   ALT 7 05/02/2020   AST 11 05/02/2020   ALKPHOS 115 05/02/2020   BILITOT 0.2 05/02/2020   Lab Results  Component Value Date   HGBA1C 6.0 (H) 10/10/2020   HGBA1C 6.0 (H) 05/02/2020   HGBA1C 6.1 (H) 03/27/2019   HGBA1C 5.9 (H) 03/14/2018   HGBA1C 5.7 (H) 02/01/2017   Lab Results  Component Value Date   INSULIN 26.7 (H) 10/10/2020   INSULIN 32.5 (H) 06/13/2020   Lab Results  Component Value Date   TSH 2.030 05/02/2020   Lab Results  Component Value Date   CHOL 133 05/02/2020   HDL 43 05/02/2020   LDLCALC 74 05/02/2020   TRIG 79 05/02/2020   CHOLHDL 3.1 05/02/2020   Lab Results  Component Value Date   WBC 9.0 05/02/2020   HGB 11.5 05/02/2020   HCT 36.4 05/02/2020   MCV 81 05/02/2020   No results found for: IRON, TIBC, FERRITIN  Attestation Statements:   Reviewed by clinician on day of visit: allergies, medications, problem  list, medical history, surgical history, family history, social history, and previous encounter notes.  Time spent on visit including pre-visit chart review and post-visit care and charting was 20 minutes.   Edmund Hilda, CMA, am acting as Energy manager for [employer], DO.  I have reviewed the above documentation for accuracy and completeness, and I agree with the above. Corinna Capra, DO

## 2020-12-01 ENCOUNTER — Encounter (INDEPENDENT_AMBULATORY_CARE_PROVIDER_SITE_OTHER): Payer: Self-pay | Admitting: Bariatrics

## 2020-12-04 ENCOUNTER — Other Ambulatory Visit (INDEPENDENT_AMBULATORY_CARE_PROVIDER_SITE_OTHER): Payer: Self-pay | Admitting: Bariatrics

## 2020-12-04 DIAGNOSIS — E559 Vitamin D deficiency, unspecified: Secondary | ICD-10-CM

## 2020-12-08 ENCOUNTER — Encounter (INDEPENDENT_AMBULATORY_CARE_PROVIDER_SITE_OTHER): Payer: Self-pay | Admitting: Bariatrics

## 2020-12-08 ENCOUNTER — Ambulatory Visit (INDEPENDENT_AMBULATORY_CARE_PROVIDER_SITE_OTHER): Payer: BC Managed Care – PPO | Admitting: Bariatrics

## 2020-12-08 ENCOUNTER — Other Ambulatory Visit: Payer: Self-pay

## 2020-12-08 VITALS — BP 116/75 | HR 82 | Temp 98.0°F | Ht 65.0 in | Wt 256.0 lb

## 2020-12-08 DIAGNOSIS — E559 Vitamin D deficiency, unspecified: Secondary | ICD-10-CM | POA: Diagnosis not present

## 2020-12-08 DIAGNOSIS — R7303 Prediabetes: Secondary | ICD-10-CM

## 2020-12-08 DIAGNOSIS — Z6841 Body Mass Index (BMI) 40.0 and over, adult: Secondary | ICD-10-CM | POA: Diagnosis not present

## 2020-12-08 DIAGNOSIS — Z9189 Other specified personal risk factors, not elsewhere classified: Secondary | ICD-10-CM

## 2020-12-08 MED ORDER — PHENTERMINE-TOPIRAMATE ER 7.5-46 MG PO CP24: ORAL_CAPSULE | ORAL | 0 refills | Status: DC

## 2020-12-08 MED ORDER — VITAMIN D (ERGOCALCIFEROL) 1.25 MG (50000 UNIT) PO CAPS: 50000.0000 [IU] | ORAL_CAPSULE | ORAL | 0 refills | Status: DC

## 2020-12-15 NOTE — Progress Notes (Signed)
Chief Complaint:   OBESITY Sara Stevenson is here to discuss her progress with her obesity treatment plan along with follow-up of her obesity related diagnoses. Sara Stevenson is on the Category 2 Plan and states she is following her eating plan approximately 60% of the time. Sara Stevenson states she is walking for 30 minutes 2 times per week.  Today's visit was #: 10 Starting weight: 289 lbs Starting date: 06/13/2020 Today's weight: 256 lbs Today's date: 12/08/2020 Total lbs lost to date: 33 lbs Total lbs lost since last in-office visit: 2 lbs  Interim History: Sara Stevenson is down 2 lbs and doing well overall. She is taking Qsymia and is down 28 lbs since starting Qsymia.  Subjective:   1. Vitamin D deficiency Sara Stevenson is currently taking prescription vitamin D 50,000 IU each week. She denies nausea, vomiting or muscle weakness.  2. Pre-diabetes Sara Stevenson is not on any medications.  3. At risk for activity intolerance Sara Stevenson has a risk for activity intolerance due to weather and obesity.  Assessment/Plan:   1. Vitamin D deficiency Sara Stevenson agrees to continue to take prescription Vitamin D, and we will refill for 1 month. She will follow-up for routine testing of Vitamin D, at least 2-3 times per year to avoid over-replacement.  - Vitamin D, Ergocalciferol, (DRISDOL) 1.25 MG (50000 UNIT) CAPS capsule; Take 1 capsule (50,000 Units total) by mouth every 7 (seven) days.  Dispense: 4 capsule; Refill: 0  2. Pre-diabetes Sara Stevenson will continue to work on weight loss, exercise, and decreasing simple carbohydrates to help decrease the risk of diabetes.   3. At risk for activity intolerance Sara Stevenson was given approximately 15 minutes of exercise intolerance counseling today. She is 30 y.o. female and has risk factors exercise intolerance including obesity. We discussed intensive lifestyle modifications today with an emphasis on specific weight loss instructions and strategies. Sara Stevenson will slowly increase activity as  tolerated.  Repetitive spaced learning was employed today to elicit superior memory formation and behavioral change.  4. Obesity, current BMI 42.7 Sara Stevenson is currently in the action stage of change. As such, her goal is to continue with weight loss efforts. She has agreed to the Category 2 Plan.   Sara Stevenson is meal planning and intentional eating.   We discussed various medication options to help Sara Stevenson with her weight loss efforts and we both agreed to continue Qsymia, and we will refill for 1 month.  - Phentermine-Topiramate 7.5-46 MG CP24; 1 capsule daily  Dispense: 30 capsule; Refill: 0  Exercise goals:  As is  Behavioral modification strategies: increasing lean protein intake, decreasing simple carbohydrates, increasing vegetables, increasing water intake, decreasing eating out, no skipping meals, meal planning and cooking strategies, keeping healthy foods in the home, and planning for success.  Sara Stevenson has agreed to follow-up with our clinic in 3 weeks. She was informed of the importance of frequent follow-up visits to maximize her success with intensive lifestyle modifications for her multiple health conditions.   Objective:   Blood pressure 116/75, pulse 82, temperature 98 F (36.7 C), height 5\' 5"  (1.651 m), weight 256 lb (116.1 kg), last menstrual period 11/14/2020, SpO2 99 %. Body mass index is 42.6 kg/m.  General: Cooperative, alert, well developed, in no acute distress. HEENT: Conjunctivae and lids unremarkable. Cardiovascular: Regular rhythm.  Lungs: Normal work of breathing. Neurologic: No focal deficits.   Lab Results  Component Value Date   CREATININE 0.97 05/02/2020   BUN 10 05/02/2020   NA 140 05/02/2020   K 4.2 05/02/2020  CL 107 (H) 05/02/2020   CO2 20 05/02/2020   Lab Results  Component Value Date   ALT 7 05/02/2020   AST 11 05/02/2020   ALKPHOS 115 05/02/2020   BILITOT 0.2 05/02/2020   Lab Results  Component Value Date   HGBA1C 6.0 (H) 10/10/2020    HGBA1C 6.0 (H) 05/02/2020   HGBA1C 6.1 (H) 03/27/2019   HGBA1C 5.9 (H) 03/14/2018   HGBA1C 5.7 (H) 02/01/2017   Lab Results  Component Value Date   INSULIN 26.7 (H) 10/10/2020   INSULIN 32.5 (H) 06/13/2020   Lab Results  Component Value Date   TSH 2.030 05/02/2020   Lab Results  Component Value Date   CHOL 133 05/02/2020   HDL 43 05/02/2020   LDLCALC 74 05/02/2020   TRIG 79 05/02/2020   CHOLHDL 3.1 05/02/2020   Lab Results  Component Value Date   VD25OH 28.6 (L) 10/10/2020   VD25OH 5.9 (L) 06/13/2020   Lab Results  Component Value Date   WBC 9.0 05/02/2020   HGB 11.5 05/02/2020   HCT 36.4 05/02/2020   MCV 81 05/02/2020   No results found for: IRON, TIBC, FERRITIN  Attestation Statements:   Reviewed by clinician on day of visit: allergies, medications, problem list, medical history, surgical history, family history, social history, and previous encounter notes.  I, Jackson Latino, RMA, am acting as Energy manager for [employer], DO.   I have reviewed the above documentation for accuracy and completeness, and I agree with the above. Corinna Capra, DO

## 2020-12-17 ENCOUNTER — Encounter (INDEPENDENT_AMBULATORY_CARE_PROVIDER_SITE_OTHER): Payer: Self-pay | Admitting: Bariatrics

## 2020-12-26 ENCOUNTER — Encounter (INDEPENDENT_AMBULATORY_CARE_PROVIDER_SITE_OTHER): Payer: Self-pay | Admitting: Bariatrics

## 2020-12-26 ENCOUNTER — Other Ambulatory Visit: Payer: Self-pay

## 2020-12-26 ENCOUNTER — Ambulatory Visit (INDEPENDENT_AMBULATORY_CARE_PROVIDER_SITE_OTHER): Payer: BC Managed Care – PPO | Admitting: Bariatrics

## 2020-12-26 VITALS — BP 119/75 | HR 79 | Temp 98.9°F | Ht 65.0 in | Wt 257.0 lb

## 2020-12-26 DIAGNOSIS — E559 Vitamin D deficiency, unspecified: Secondary | ICD-10-CM

## 2020-12-26 DIAGNOSIS — Z6841 Body Mass Index (BMI) 40.0 and over, adult: Secondary | ICD-10-CM | POA: Diagnosis not present

## 2020-12-26 DIAGNOSIS — R7303 Prediabetes: Secondary | ICD-10-CM | POA: Diagnosis not present

## 2021-01-02 ENCOUNTER — Encounter (INDEPENDENT_AMBULATORY_CARE_PROVIDER_SITE_OTHER): Payer: Self-pay | Admitting: Bariatrics

## 2021-01-02 NOTE — Progress Notes (Signed)
Chief Complaint:   OBESITY Sara Stevenson is here to discuss her progress with her obesity treatment plan along with follow-up of her obesity related diagnoses. Sara Stevenson is on the Category 2 Plan and states she is following her eating plan approximately 60% of the time. Sara Stevenson states she is walking 30 minutes 2 times per week.  Today's visit was #: 11 Starting weight: 289 lbs Starting date: 06/13/2020 Today's weight: 257 lbs Today's date: 12/26/2020 Total lbs lost to date: 32 Total lbs lost since last in-office visit: 0  Interim History: Sara Stevenson is up 1 lb but has done well overall. She is taking Qsymia without problems (appetite normal and no cravings). She is doing ok with water.  Subjective:   1. Vitamin D deficiency Sara Stevenson is taking prescription Vit D.  2. Pre-diabetes She is not taking medication.  Assessment/Plan:   1. Vitamin D deficiency Low Vitamin D level contributes to fatigue and are associated with obesity, breast, and colon cancer. She agrees to continue to take prescription Vitamin D @50 ,000 IU every week and will follow-up for routine testing of Vitamin D, at least 2-3 times per year to avoid over-replacement.  2. Pre-diabetes will increase healthy fats and protein. She will continue to work on weight loss, exercise, and decreasing simple carbohydrates to help decrease the risk of diabetes.   3. Obesity, current BMI 42.9  Sara Stevenson is currently in the action stage of change. As such, her goal is to continue with weight loss efforts. She has agreed to the Category 2 Plan.   Meal planning Sara Stevenson will adhere closely to the plan.  Exercise goals:  As is  Behavioral modification strategies: increasing lean protein intake, decreasing simple carbohydrates, increasing vegetables, increasing water intake, decreasing eating out, no skipping meals, meal planning and cooking strategies, keeping healthy foods in the home, travel eating strategies, and celebration eating  strategies.  Sara Stevenson has agreed to follow-up with our clinic in 2-3 weeks. She was informed of the importance of frequent follow-up visits to maximize her success with intensive lifestyle modifications for her multiple health conditions.   Objective:   Blood pressure 119/75, pulse 79, temperature 98.9 F (37.2 C), height 5\' 5"  (1.651 m), weight 257 lb (116.6 kg), last menstrual period 12/15/2020, SpO2 99 %. Body mass index is 42.77 kg/m.  General: Cooperative, alert, well developed, in no acute distress. HEENT: Conjunctivae and lids unremarkable. Cardiovascular: Regular rhythm.  Lungs: Normal work of breathing. Neurologic: No focal deficits.   Lab Results  Component Value Date   CREATININE 0.97 05/02/2020   BUN 10 05/02/2020   NA 140 05/02/2020   K 4.2 05/02/2020   CL 107 (H) 05/02/2020   CO2 20 05/02/2020   Lab Results  Component Value Date   ALT 7 05/02/2020   AST 11 05/02/2020   ALKPHOS 115 05/02/2020   BILITOT 0.2 05/02/2020   Lab Results  Component Value Date   HGBA1C 6.0 (H) 10/10/2020   HGBA1C 6.0 (H) 05/02/2020   HGBA1C 6.1 (H) 03/27/2019   HGBA1C 5.9 (H) 03/14/2018   HGBA1C 5.7 (H) 02/01/2017   Lab Results  Component Value Date   INSULIN 26.7 (H) 10/10/2020   INSULIN 32.5 (H) 06/13/2020   Lab Results  Component Value Date   TSH 2.030 05/02/2020   Lab Results  Component Value Date   CHOL 133 05/02/2020   HDL 43 05/02/2020   LDLCALC 74 05/02/2020   TRIG 79 05/02/2020   CHOLHDL 3.1 05/02/2020   Lab Results  Component Value Date   VD25OH 28.6 (L) 10/10/2020   VD25OH 5.9 (L) 06/13/2020   Lab Results  Component Value Date   WBC 9.0 05/02/2020   HGB 11.5 05/02/2020   HCT 36.4 05/02/2020   MCV 81 05/02/2020   No results found for: IRON, TIBC, FERRITIN  Attestation Statements:   Reviewed by clinician on day of visit: allergies, medications, problem list, medical history, surgical history, family history, social history, and previous encounter  notes.  Time spent on visit including pre-visit chart review and post-visit care and charting was 20 minutes.   Edmund Hilda, CMA, am acting as Energy manager for [employer], DO.  I have reviewed the above documentation for accuracy and completeness, and I agree with the above. Corinna Capra, DO

## 2021-01-10 ENCOUNTER — Ambulatory Visit (INDEPENDENT_AMBULATORY_CARE_PROVIDER_SITE_OTHER): Payer: BC Managed Care – PPO | Admitting: Bariatrics

## 2021-01-10 ENCOUNTER — Other Ambulatory Visit: Payer: Self-pay

## 2021-01-10 ENCOUNTER — Encounter (INDEPENDENT_AMBULATORY_CARE_PROVIDER_SITE_OTHER): Payer: Self-pay | Admitting: Bariatrics

## 2021-01-10 VITALS — BP 120/80 | HR 80 | Temp 98.3°F | Ht 65.0 in | Wt 251.0 lb

## 2021-01-10 DIAGNOSIS — Z9189 Other specified personal risk factors, not elsewhere classified: Secondary | ICD-10-CM

## 2021-01-10 DIAGNOSIS — R7303 Prediabetes: Secondary | ICD-10-CM | POA: Diagnosis not present

## 2021-01-10 DIAGNOSIS — Z6841 Body Mass Index (BMI) 40.0 and over, adult: Secondary | ICD-10-CM

## 2021-01-10 DIAGNOSIS — E559 Vitamin D deficiency, unspecified: Secondary | ICD-10-CM | POA: Diagnosis not present

## 2021-01-10 DIAGNOSIS — E66813 Obesity, class 3: Secondary | ICD-10-CM

## 2021-01-10 MED ORDER — PHENTERMINE-TOPIRAMATE ER 7.5-46 MG PO CP24: ORAL_CAPSULE | ORAL | 0 refills | Status: DC

## 2021-01-10 MED ORDER — VITAMIN D (ERGOCALCIFEROL) 1.25 MG (50000 UNIT) PO CAPS: 50000.0000 [IU] | ORAL_CAPSULE | ORAL | 0 refills | Status: DC

## 2021-01-11 NOTE — Progress Notes (Signed)
Chief Complaint:   OBESITY Sara Stevenson is here to discuss her progress with her obesity treatment plan along with follow-up of her obesity related diagnoses. Chelesa is on the Category 2 Plan and states she is following her eating plan approximately 50% of the time. [name] states she is walking for 30 minutes 2 times per week.  Today's visit was #: 12 Starting weight: 289 lbs Starting date: 06/13/2020 Today's weight: 257 lbs Today's date:01/10/2021 Total lbs lost to date: 32 lbs Total lbs lost since last in-office visit: 6 lbs  Interim History: [name] is down another 6 lbs and doing well overall. She is doing well with protein and water.  Subjective:   1. Vitamin D deficiency [name] is taking Vitamin D.  2. Pre-diabetes Neveen is not on medications for pre diabetes.  3. At risk for diabetes mellitus [name] is at risk for diabetes mellitus due to obesity.  Assessment/Plan:   1. Vitamin D deficiency Low Vitamin D level contributes to fatigue and are associated with obesity, breast, and colon cancer. We will refill prescription Vitamin D 50,000 IU every week and [name] will follow-up for routine testing of Vitamin D, at least 2-3 times per year to avoid over-replacement.  - Vitamin D, Ergocalciferol, (DRISDOL) 1.25 MG (50000 UNIT) CAPS capsule; Take 1 capsule (50,000 Units total) by mouth every 7 (seven) days.  Dispense: 4 capsule; Refill: 0  2. Pre-diabetes Jeliyah will continue to exercise and the plan. She will work on weight loss, exercise, and decreasing simple carbohydrates to help decrease the risk of diabetes.    3. At risk for diabetes mellitus [name] was given approximately 15 minutes of diabetes education and counseling today. We discussed intensive lifestyle modifications today with an emphasis on weight loss as well as increasing exercise and decreasing simple carbohydrates in her diet. We also reviewed medication options with an emphasis on risk versus benefit of those  discussed.   Repetitive spaced learning was employed today to elicit superior memory formation and behavioral change.   4. Obesity, current BMI 41.8 [name] is currently in the action stage of change. As such, her goal is to continue with weight loss efforts. She has agreed to the Category 2 Plan.   Jaslyne will continue meal planning. She will continue intentional eating. Emma-Lee agrees to start Qsymia 7.5 mg for 1 month with no refills.  - Phentermine-Topiramate 7.5-46 MG CP24; 1 capsule daily  Dispense: 30 capsule; Refill: 0  Exercise goals:  As is.  Behavioral modification strategies: increasing lean protein intake, decreasing simple carbohydrates, increasing vegetables, increasing water intake, decreasing eating out, no skipping meals, meal planning and cooking strategies, keeping healthy foods in the home, and planning for success.  Jessina has agreed to follow-up with our clinic in 2-3 weeks. She was informed of the importance of frequent follow-up visits to maximize her success with intensive lifestyle modifications for her multiple health conditions.   Objective:   Blood pressure 120/80, pulse 80, temperature 98.3 F (36.8 C), height 5\' 5"  (1.651 m), weight 251 lb (113.9 kg), last menstrual period 12/15/2020, SpO2 98 %. Body mass index is 41.77 kg/m.  General: Cooperative, alert, well developed, in no acute distress. HEENT: Conjunctivae and lids unremarkable. Cardiovascular: Regular rhythm.  Lungs: Normal work of breathing. Neurologic: No focal deficits.   Lab Results  Component Value Date   CREATININE 0.97 05/02/2020   BUN 10 05/02/2020   NA 140 05/02/2020   K 4.2 05/02/2020   CL 107 (H) 05/02/2020  CO2 20 05/02/2020   Lab Results  Component Value Date   ALT 7 05/02/2020   AST 11 05/02/2020   ALKPHOS 115 05/02/2020   BILITOT 0.2 05/02/2020   Lab Results  Component Value Date   HGBA1C 6.0 (H) 10/10/2020   HGBA1C 6.0 (H) 05/02/2020   HGBA1C 6.1 (H) 03/27/2019    HGBA1C 5.9 (H) 03/14/2018   HGBA1C 5.7 (H) 02/01/2017   Lab Results  Component Value Date   INSULIN 26.7 (H) 10/10/2020   INSULIN 32.5 (H) 06/13/2020   Lab Results  Component Value Date   TSH 2.030 05/02/2020   Lab Results  Component Value Date   CHOL 133 05/02/2020   HDL 43 05/02/2020   LDLCALC 74 05/02/2020   TRIG 79 05/02/2020   CHOLHDL 3.1 05/02/2020   Lab Results  Component Value Date   VD25OH 28.6 (L) 10/10/2020   VD25OH 5.9 (L) 06/13/2020   Lab Results  Component Value Date   WBC 9.0 05/02/2020   HGB 11.5 05/02/2020   HCT 36.4 05/02/2020   MCV 81 05/02/2020   No results found for: IRON, TIBC, FERRITIN  Attestation Statements:   Reviewed by clinician on day of visit: allergies, medications, problem list, medical history, surgical history, family history, social history, and previous encounter notes.  I, Jackson Latino, RMA, am acting as Energy manager for [employer], DO.   I have reviewed the above documentation for accuracy and completeness, and I agree with the above. Corinna Capra, DO

## 2021-01-12 ENCOUNTER — Encounter (INDEPENDENT_AMBULATORY_CARE_PROVIDER_SITE_OTHER): Payer: Self-pay | Admitting: Bariatrics

## 2021-01-31 ENCOUNTER — Encounter (INDEPENDENT_AMBULATORY_CARE_PROVIDER_SITE_OTHER): Payer: Self-pay

## 2021-02-01 ENCOUNTER — Encounter (INDEPENDENT_AMBULATORY_CARE_PROVIDER_SITE_OTHER): Payer: Self-pay | Admitting: Bariatrics

## 2021-02-01 ENCOUNTER — Other Ambulatory Visit: Payer: Self-pay

## 2021-02-01 ENCOUNTER — Ambulatory Visit (INDEPENDENT_AMBULATORY_CARE_PROVIDER_SITE_OTHER): Payer: BC Managed Care – PPO | Admitting: Bariatrics

## 2021-02-01 VITALS — BP 109/72 | HR 74 | Temp 98.4°F | Ht 65.0 in | Wt 251.0 lb

## 2021-02-01 DIAGNOSIS — Z6841 Body Mass Index (BMI) 40.0 and over, adult: Secondary | ICD-10-CM | POA: Diagnosis not present

## 2021-02-01 DIAGNOSIS — R7303 Prediabetes: Secondary | ICD-10-CM

## 2021-02-01 DIAGNOSIS — E559 Vitamin D deficiency, unspecified: Secondary | ICD-10-CM | POA: Diagnosis not present

## 2021-02-02 NOTE — Progress Notes (Signed)
Chief Complaint:   OBESITY Sara Stevenson is here to discuss her progress with her obesity treatment plan along with follow-up of her obesity related diagnoses. Sara Stevenson is on the Category 2 Plan and states she is following her eating plan approximately 50% of the time. Sara Stevenson states she is walking for 30 minutes 2 times per week.  Today's visit was #: 13 Starting weight: 289 lbs Starting date: 06/13/2020 Today's weight: 251 lbs Today's date: 02/01/2021 Total lbs lost to date: 38 lbs Total lbs lost since last in-office visit: 0  Interim History: Sara Stevenson has been on vacation, but she managed not to gain weight. She stated Qsymia was given as a prescription. She was approved yesterday.  Subjective:   1. Pre-diabetes Sara Stevenson is currently not on mediations.  2. Vitamin D deficiency Sara Stevenson is currently taking Vitamin D.  Assessment/Plan:   1. Pre-diabetes Sara Stevenson will continue to work on weight loss, exercise, and decreasing simple carbohydrates to help decrease the risk of diabetes. She will increase protein and fats.   2. Vitamin D deficiency Low Vitamin D level contributes to fatigue and are associated with obesity, breast, and colon cancer. Sara Stevenson will continue to take prescription Vitamin D 50,000 IU every week and she will follow-up for routine testing of Vitamin D, at least 2-3 times per year to avoid over-replacement.   3. obesity, current BMI 41.8 Sara Stevenson is currently in the action stage of change. As such, her goal is to continue with weight loss efforts. She has agreed to the Category 2 Plan.   Sara Stevenson will continue meal planning. She will stay adherent to the program. She will continue Qsymia.  Exercise goals:  Sara Stevenson will play volleyball.  Behavioral modification strategies: increasing lean protein intake, decreasing simple carbohydrates, increasing vegetables, increasing water intake, decreasing eating out, no skipping meals, meal planning and cooking strategies, keeping  healthy foods in the home, and planning for success.  Sara Stevenson has agreed to follow-up with our clinic in 2 weeks (fasting). She was informed of the importance of frequent follow-up visits to maximize her success with intensive lifestyle modifications for her multiple health conditions.   Objective:   Blood pressure 109/72, pulse 74, temperature 98.4 F (36.9 C), height 5\' 5"  (1.651 m), weight 251 lb (113.9 kg), SpO2 93 %. Body mass index is 41.77 kg/m.  General: Cooperative, alert, well developed, in no acute distress. HEENT: Conjunctivae and lids unremarkable. Cardiovascular: Regular rhythm.  Lungs: Normal work of breathing. Neurologic: No focal deficits.   Lab Results  Component Value Date   CREATININE 0.97 05/02/2020   BUN 10 05/02/2020   NA 140 05/02/2020   K 4.2 05/02/2020   CL 107 (H) 05/02/2020   CO2 20 05/02/2020   Lab Results  Component Value Date   ALT 7 05/02/2020   AST 11 05/02/2020   ALKPHOS 115 05/02/2020   BILITOT 0.2 05/02/2020   Lab Results  Component Value Date   HGBA1C 6.0 (H) 10/10/2020   HGBA1C 6.0 (H) 05/02/2020   HGBA1C 6.1 (H) 03/27/2019   HGBA1C 5.9 (H) 03/14/2018   HGBA1C 5.7 (H) 02/01/2017   Lab Results  Component Value Date   INSULIN 26.7 (H) 10/10/2020   INSULIN 32.5 (H) 06/13/2020   Lab Results  Component Value Date   TSH 2.030 05/02/2020   Lab Results  Component Value Date   CHOL 133 05/02/2020   HDL 43 05/02/2020   LDLCALC 74 05/02/2020   TRIG 79 05/02/2020   CHOLHDL 3.1 05/02/2020  Lab Results  Component Value Date   VD25OH 28.6 (L) 10/10/2020   VD25OH 5.9 (L) 06/13/2020   Lab Results  Component Value Date   WBC 9.0 05/02/2020   HGB 11.5 05/02/2020   HCT 36.4 05/02/2020   MCV 81 05/02/2020   No results found for: IRON, TIBC, FERRITIN  Attestation Statements:   Reviewed by clinician on day of visit: allergies, medications, problem list, medical history, surgical history, family history, social history, and  previous encounter notes.  Time spent on visit including pre-visit chart review and post-visit care and charting was 20 minutes.   I, Jackson Latino, RMA, am acting as Energy manager for [employer], DO.   I have reviewed the above documentation for accuracy and completeness, and I agree with the above. Corinna Capra, DO

## 2021-02-03 ENCOUNTER — Encounter (INDEPENDENT_AMBULATORY_CARE_PROVIDER_SITE_OTHER): Payer: Self-pay | Admitting: Bariatrics

## 2021-02-07 ENCOUNTER — Other Ambulatory Visit (INDEPENDENT_AMBULATORY_CARE_PROVIDER_SITE_OTHER): Payer: Self-pay | Admitting: Bariatrics

## 2021-02-07 DIAGNOSIS — E559 Vitamin D deficiency, unspecified: Secondary | ICD-10-CM

## 2021-02-07 NOTE — Telephone Encounter (Signed)
Dr.Brown 

## 2021-03-06 ENCOUNTER — Ambulatory Visit (INDEPENDENT_AMBULATORY_CARE_PROVIDER_SITE_OTHER): Payer: BC Managed Care – PPO | Admitting: Bariatrics

## 2021-03-06 ENCOUNTER — Encounter (INDEPENDENT_AMBULATORY_CARE_PROVIDER_SITE_OTHER): Payer: Self-pay | Admitting: Bariatrics

## 2021-03-06 ENCOUNTER — Other Ambulatory Visit: Payer: Self-pay

## 2021-03-06 VITALS — BP 111/71 | HR 79 | Temp 98.7°F | Ht 65.0 in | Wt 247.0 lb

## 2021-03-06 DIAGNOSIS — Z6841 Body Mass Index (BMI) 40.0 and over, adult: Secondary | ICD-10-CM

## 2021-03-06 DIAGNOSIS — Z9189 Other specified personal risk factors, not elsewhere classified: Secondary | ICD-10-CM

## 2021-03-06 DIAGNOSIS — E538 Deficiency of other specified B group vitamins: Secondary | ICD-10-CM

## 2021-03-06 DIAGNOSIS — R7303 Prediabetes: Secondary | ICD-10-CM | POA: Diagnosis not present

## 2021-03-06 DIAGNOSIS — E66813 Obesity, class 3: Secondary | ICD-10-CM

## 2021-03-06 DIAGNOSIS — E559 Vitamin D deficiency, unspecified: Secondary | ICD-10-CM

## 2021-03-06 MED ORDER — PHENTERMINE-TOPIRAMATE ER 7.5-46 MG PO CP24: ORAL_CAPSULE | ORAL | 0 refills | Status: DC

## 2021-03-06 MED ORDER — VITAMIN D (ERGOCALCIFEROL) 1.25 MG (50000 UNIT) PO CAPS: 50000.0000 [IU] | ORAL_CAPSULE | ORAL | 0 refills | Status: DC

## 2021-03-06 NOTE — Progress Notes (Signed)
Chief Complaint:   OBESITY Sara Stevenson is here to discuss her progress with her obesity treatment plan along with follow-up of her obesity related diagnoses. Sara Stevenson is on the Category 2 Plan and states she is following her eating plan approximately 50% of the time. Sara Stevenson states she is playing volleyball for 2 hours  2 times per week.  Today's visit was #: 14 Starting weight: 289 lbs Starting date: 06/13/2020 Today's weight: 247 lbs Today's date: 03/06/2021 Total lbs lost to date: 42 lbs Total lbs lost since last in-office visit: 4 lbs  Interim History: Sara Stevenson is down an additional 4 lbs and doing well overall.  Subjective:   1. Pre-diabetes [name] is not on medications currently.  2. Vitamin D deficiency Sara Stevenson is currently taking Vitamin D.   3. Vitamin B 12 deficiency Sara Stevenson is currently taking Vitamin B 12.  4. At risk for diabetes mellitus Sara Stevenson is at risk for diabetes mellitus due to pre-diabetes.   Assessment/Plan:   1. Pre-diabetes Sara Stevenson will continue to work on weight loss, exercise, and decreasing simple carbohydrates to help decrease the risk of diabetes. She will keep fats and protein high. We will check labs today.   - Insulin, random - Hemoglobin A1c - Comprehensive metabolic panel  2. Vitamin D deficiency Low Vitamin D level contributes to fatigue and are associated with obesity, breast, and colon cancer. We will refill prescription Vitamin D 50,000 IU every week and Sara Stevenson will follow-up for routine testing of Vitamin D, at least 2-3 times per year to avoid over-replacement.  - Vitamin D, Ergocalciferol, (DRISDOL) 1.25 MG (50000 UNIT) CAPS capsule; Take 1 capsule (50,000 Units total) by mouth every 7 (seven) days.  Dispense: 4 capsule; Refill: 0 - VITAMIN D 25 Hydroxy (Vit-D Deficiency, Fractures)  3. Vitamin B 12 deficiency Orders and follow up as documented in patient record. We will check Vitamin B 12 today.  Counseling Iron is essential for our  bodies to make red blood cells.  Reasons that someone may be deficient include: an iron-deficient diet (more likely in those following vegan or vegetarian diets), women with heavy menses, patients with GI disorders or poor absorption, patients that have had bariatric surgery, frequent blood donors, patients with cancer, and patients with heart disease.   An iron supplement has been recommended. This is found over-the-counter.  Iron-rich foods include dark leafy greens, red and white meats, eggs, seafood, and beans.   Certain foods and drinks prevent your body from absorbing iron properly. Avoid eating these foods in the same meal as iron-rich foods or with iron supplements. These foods include: coffee, black tea, and red wine; milk, dairy products, and foods that are high in calcium; beans and soybeans; whole grains.  Constipation can be a side effect of iron supplementation. Increased water and fiber intake are helpful. Water goal: > 2 liters/day. Fiber goal: > 25 grams/day.  - Vitamin B12  4. At risk for diabetes mellitus Sara Stevenson was given approximately 15 minutes of diabetes education and counseling today. We discussed intensive lifestyle modifications today with an emphasis on weight loss as well as increasing exercise and decreasing simple carbohydrates in her diet. We also reviewed medication options with an emphasis on risk versus benefit of those discussed.   Repetitive spaced learning was employed today to elicit superior memory formation and behavioral change.   5. Obesity, current BMI 41.1 Sara Stevenson is currently in the action stage of change. As such, her goal is to continue with weight loss efforts.  She has agreed to the Category 2 Plan.   Sara Stevenson will continue meal planning. She will adhere closely to the plan (80-90%). We will refill Qsymia 7.5 mg for 1 month with no refills.  - Phentermine-Topiramate 7.5-46 MG CP24; 1 capsule daily  Dispense: 30 capsule; Refill: 0  Exercise goals:   Sara Stevenson will continue volleyball and [employer].  Behavioral modification strategies: increasing lean protein intake, decreasing simple carbohydrates, increasing vegetables, increasing water intake, decreasing eating out, no skipping meals, meal planning and cooking strategies, keeping healthy foods in the home, and planning for success.  Sara Stevenson has agreed to follow-up with our clinic in 3-4 weeks with labs. She was informed of the importance of frequent follow-up visits to maximize her success with intensive lifestyle modifications for her multiple health conditions.   Objective:   Blood pressure 111/71, pulse 79, temperature 98.7 F (37.1 C), height 5\' 5"  (1.651 m), SpO2 99 %. Body mass index is 41.77 kg/m.  General: Cooperative, alert, well developed, in no acute distress. HEENT: Conjunctivae and lids unremarkable. Cardiovascular: Regular rhythm.  Lungs: Normal work of breathing. Neurologic: No focal deficits.   Lab Results  Component Value Date   CREATININE 0.97 05/02/2020   BUN 10 05/02/2020   NA 140 05/02/2020   K 4.2 05/02/2020   CL 107 (H) 05/02/2020   CO2 20 05/02/2020   Lab Results  Component Value Date   ALT 7 05/02/2020   AST 11 05/02/2020   ALKPHOS 115 05/02/2020   BILITOT 0.2 05/02/2020   Lab Results  Component Value Date   HGBA1C 6.0 (H) 10/10/2020   HGBA1C 6.0 (H) 05/02/2020   HGBA1C 6.1 (H) 03/27/2019   HGBA1C 5.9 (H) 03/14/2018   HGBA1C 5.7 (H) 02/01/2017   Lab Results  Component Value Date   INSULIN 26.7 (H) 10/10/2020   INSULIN 32.5 (H) 06/13/2020   Lab Results  Component Value Date   TSH 2.030 05/02/2020   Lab Results  Component Value Date   CHOL 133 05/02/2020   HDL 43 05/02/2020   LDLCALC 74 05/02/2020   TRIG 79 05/02/2020   CHOLHDL 3.1 05/02/2020   Lab Results  Component Value Date   VD25OH 28.6 (L) 10/10/2020   VD25OH 5.9 (L) 06/13/2020   Lab Results  Component Value Date   WBC 9.0 05/02/2020   HGB 11.5 05/02/2020   HCT 36.4  05/02/2020   MCV 81 05/02/2020   No results found for: IRON, TIBC, FERRITIN  Attestation Statements:   Reviewed by clinician on day of visit: allergies, medications, problem list, medical history, surgical history, family history, social history, and previous encounter notes.   I, 05/04/2020, RMA, am acting as Jackson Latino for Energy manager, DO.   I have reviewed the above documentation for accuracy and completeness, and I agree with the above. [employer], DO

## 2021-03-07 ENCOUNTER — Encounter (INDEPENDENT_AMBULATORY_CARE_PROVIDER_SITE_OTHER): Payer: Self-pay | Admitting: Bariatrics

## 2021-03-07 LAB — COMPREHENSIVE METABOLIC PANEL
ALT: 7 IU/L (ref 0–32)
AST: 13 IU/L (ref 0–40)
Albumin/Globulin Ratio: 1.1 — ABNORMAL LOW (ref 1.2–2.2)
Albumin: 3.6 g/dL — ABNORMAL LOW (ref 3.8–4.8)
Alkaline Phosphatase: 127 IU/L — ABNORMAL HIGH (ref 44–121)
BUN/Creatinine Ratio: 9 (ref 9–23)
BUN: 9 mg/dL (ref 6–20)
Bilirubin Total: <0.2 mg/dL (ref 0.0–1.2)
CO2: 18 mmol/L — ABNORMAL LOW (ref 20–29)
Calcium: 8.5 mg/dL — ABNORMAL LOW (ref 8.7–10.2)
Chloride: 108 mmol/L — ABNORMAL HIGH (ref 96–106)
Creatinine, Ser: 0.95 mg/dL (ref 0.57–1.00)
Globulin, Total: 3.4 g/dL (ref 1.5–4.5)
Glucose: 85 mg/dL (ref 70–99)
Potassium: 4 mmol/L (ref 3.5–5.2)
Sodium: 140 mmol/L (ref 134–144)
Total Protein: 7 g/dL (ref 6.0–8.5)
eGFR: 82 mL/min/{1.73_m2} (ref >59)

## 2021-03-07 LAB — VITAMIN D 25 HYDROXY (VIT D DEFICIENCY, FRACTURES): Vit D, 25-Hydroxy: 32.7 ng/mL (ref 30.0–100.0)

## 2021-03-07 LAB — INSULIN, RANDOM: INSULIN: 13.8 u[IU]/mL (ref 2.6–24.9)

## 2021-03-07 LAB — HEMOGLOBIN A1C
Est. average glucose Bld gHb Est-mCnc: 131 mg/dL
Hgb A1c MFr Bld: 6.2 % — ABNORMAL HIGH (ref 4.8–5.6)

## 2021-03-07 LAB — VITAMIN B12: Vitamin B-12: 275 pg/mL (ref 232–1245)

## 2021-03-28 ENCOUNTER — Other Ambulatory Visit: Payer: Self-pay

## 2021-03-28 ENCOUNTER — Encounter (INDEPENDENT_AMBULATORY_CARE_PROVIDER_SITE_OTHER): Payer: Self-pay | Admitting: Bariatrics

## 2021-03-28 ENCOUNTER — Ambulatory Visit (INDEPENDENT_AMBULATORY_CARE_PROVIDER_SITE_OTHER): Payer: BC Managed Care – PPO | Admitting: Bariatrics

## 2021-03-28 VITALS — BP 104/68 | HR 94 | Temp 98.2°F | Ht 65.0 in | Wt 244.0 lb

## 2021-03-28 DIAGNOSIS — E559 Vitamin D deficiency, unspecified: Secondary | ICD-10-CM | POA: Diagnosis not present

## 2021-03-28 DIAGNOSIS — R7303 Prediabetes: Secondary | ICD-10-CM | POA: Diagnosis not present

## 2021-03-28 DIAGNOSIS — E538 Deficiency of other specified B group vitamins: Secondary | ICD-10-CM | POA: Diagnosis not present

## 2021-03-28 DIAGNOSIS — Z6841 Body Mass Index (BMI) 40.0 and over, adult: Secondary | ICD-10-CM

## 2021-03-28 NOTE — Progress Notes (Signed)
Chief Complaint:   OBESITY Sara Stevenson is here to discuss her progress with her obesity treatment plan along with follow-up of her obesity related diagnoses. Sara Stevenson is on the Category 2 Plan and states she is following her eating plan approximately 50% of the time. Sara Stevenson states she is playing volleyball for 120 minutes 2 times per week.  Today's visit was #: 15 Starting weight: 289 lbs Starting date: 06/13/2020 Today's weight: 244 lbs Today's date: 03/28/2021 Total lbs lost to date: 45 lbs Total lbs lost since last in-office visit: 3 lbs  Interim History: Sara Stevenson is down an additional 3 lbs. She is taking Phentermine and Qysmia.  Subjective:   1. Vitamin D deficiency Sara Stevenson is taking Vitamin D currently.   2. Pre-diabetes Sara Stevenson has a diagnosis of prediabetes based on her elevated HgA1c and was informed this puts her at greater risk of developing diabetes. Her last A1C level was 6.2. Her Insulin resistance level was 13.8. She continues to work on diet and exercise to decrease her risk of diabetes. She denies nausea or hypoglycemia.  3. Vitamin B 12 deficiency Sara Stevenson is taking her medications daily.  Assessment/Plan:   1. Vitamin D deficiency Low Vitamin D level contributes to fatigue and are associated with obesity, breast, and colon cancer. Sara Stevenson agrees to continue to take prescription Vitamin D 50,000 IU every week and she will follow-up for routine testing of Vitamin D, at least 2-3 times per year to avoid over-replacement.  2. Pre-diabetes Sara Stevenson will continue to work on weight loss, exercise, increase activities, and decreasing simple carbohydrates to help decrease the risk of diabetes.    3. Vitamin B 12 deficiency The diagnosis was reviewed with the patient. Counseling provided today, see below. Sara Stevenson will continue the B 12 supplement. We will continue to monitor. Orders and follow up as documented in patient record.  Counseling The body needs vitamin B12: to make red  blood cells; to make DNA; and to help the nerves work properly so they can carry messages from the brain to the body.  The main causes of vitamin B12 deficiency include dietary deficiency, digestive diseases, pernicious anemia, and having a surgery in which part of the stomach or small intestine is removed.  Certain medicines can make it harder for the body to absorb vitamin B12. These medicines include: heartburn medications; some antibiotics; some medications used to treat diabetes, gout, and high cholesterol.  In some cases, there are no symptoms of this condition. If the condition leads to anemia or nerve damage, various symptoms can occur, such as weakness or fatigue, shortness of breath, and numbness or tingling in your hands and feet.   Treatment:  May include taking vitamin B12 supplements.  Avoid alcohol.  Eat lots of healthy foods that contain vitamin B12: Beef, pork, chicken, [place], and organ meats, such as liver.  Seafood: This includes clams, rainbow trout, salmon, tuna, and haddock. Eggs.  Cereal and dairy products that are fortified: This means that vitamin B12 has been added to the food.    4. Obesity, current BMI 40.7 Sara Stevenson is currently in the action stage of change. As such, her goal is to continue with weight loss efforts. She has agreed to the Category 2 Plan.   Sara Stevenson will be mindful eating. She will continue with healthy food planning. We discussed labs from 03/06/2021.  Exercise goals:  As is.  Behavioral modification strategies: increasing lean protein intake, decreasing simple carbohydrates, increasing vegetables, increasing water intake, decreasing eating out, no  skipping meals, meal planning and cooking strategies, keeping healthy foods in the home, and planning for success.  Sara Stevenson has agreed to follow-up with our clinic in 2-3 weeks. She was informed of the importance of frequent follow-up visits to maximize her success with intensive lifestyle modifications for her  multiple health conditions.   Objective:   Blood pressure 104/68, pulse 94, temperature 98.2 F (36.8 C), height 5\' 5"  (1.651 m), weight 244 lb (110.7 kg), SpO2 100 %. Body mass index is 40.6 kg/m.  General: Cooperative, alert, well developed, in no acute distress. HEENT: Conjunctivae and lids unremarkable. Cardiovascular: Regular rhythm.  Lungs: Normal work of breathing. Neurologic: No focal deficits.   Lab Results  Component Value Date   CREATININE 0.95 03/06/2021   BUN 9 03/06/2021   NA 140 03/06/2021   K 4.0 03/06/2021   CL 108 (H) 03/06/2021   CO2 18 (L) 03/06/2021   Lab Results  Component Value Date   ALT 7 03/06/2021   AST 13 03/06/2021   ALKPHOS 127 (H) 03/06/2021   BILITOT <0.2 03/06/2021   Lab Results  Component Value Date   HGBA1C 6.2 (H) 03/06/2021   HGBA1C 6.0 (H) 10/10/2020   HGBA1C 6.0 (H) 05/02/2020   HGBA1C 6.1 (H) 03/27/2019   HGBA1C 5.9 (H) 03/14/2018   Lab Results  Component Value Date   INSULIN 13.8 03/06/2021   INSULIN 26.7 (H) 10/10/2020   INSULIN 32.5 (H) 06/13/2020   Lab Results  Component Value Date   TSH 2.030 05/02/2020   Lab Results  Component Value Date   CHOL 133 05/02/2020   HDL 43 05/02/2020   LDLCALC 74 05/02/2020   TRIG 79 05/02/2020   CHOLHDL 3.1 05/02/2020   Lab Results  Component Value Date   VD25OH 32.7 03/06/2021   VD25OH 28.6 (L) 10/10/2020   VD25OH 5.9 (L) 06/13/2020   Lab Results  Component Value Date   WBC 9.0 05/02/2020   HGB 11.5 05/02/2020   HCT 36.4 05/02/2020   MCV 81 05/02/2020   No results found for: IRON, TIBC, FERRITIN  Attestation Statements:   Reviewed by clinician on day of visit: allergies, medications, problem list, medical history, surgical history, family history, social history, and previous encounter notes.  I, 05/04/2020, RMA, am acting as Jackson Latino for Energy manager, DO.   I have reviewed the above documentation for accuracy and completeness, and I agree with the above.  [employer], DO

## 2021-03-29 ENCOUNTER — Encounter (INDEPENDENT_AMBULATORY_CARE_PROVIDER_SITE_OTHER): Payer: Self-pay | Admitting: Bariatrics

## 2021-04-05 ENCOUNTER — Other Ambulatory Visit (INDEPENDENT_AMBULATORY_CARE_PROVIDER_SITE_OTHER): Payer: Self-pay | Admitting: Bariatrics

## 2021-04-05 DIAGNOSIS — E559 Vitamin D deficiency, unspecified: Secondary | ICD-10-CM

## 2021-04-05 NOTE — Telephone Encounter (Signed)
LAST APPOINTMENT DATE: 03/28/21 NEXT APPOINTMENT DATE: 04/18/21   [id number] [place], [place] - [address] AT [facility] OF [address] [place] [address] Phone: [phone] Fax: [phone]  Patient is requesting a refill of the following medications: Pending Prescriptions:                       Disp   Refills   Vitamin D, Ergocalciferol, (DRISDOL) 1.25 *4 caps*0       Sig: TAKE 1 CAPSULE BY MOUTH EVERY 7 DAYS   Date last filled: 03/06/21 Previously prescribed by Dr. Manson Passey  Lab Results      Component                Value               Date                      HGBA1C                   6.2 (H)             03/06/2021                HGBA1C                   6.0 (H)             10/10/2020                HGBA1C                   6.0 (H)             05/02/2020           Lab Results      Component                Value               Date                      LDLCALC                  74                  05/02/2020                CREATININE               0.95                03/06/2021           Lab Results      Component                Value               Date                      VD25OH                   32.7                03/06/2021                VD25OH  28.6 (L)            10/10/2020                VD25OH                   5.9 (L)             06/13/2020            BP Readings from Last 3 Encounters: 03/28/21 : 104/68 03/06/21 : 111/71 02/01/21 : 109/72

## 2021-04-05 NOTE — Telephone Encounter (Signed)
Pt last seen by Dr. Brown.  

## 2021-04-18 ENCOUNTER — Ambulatory Visit (INDEPENDENT_AMBULATORY_CARE_PROVIDER_SITE_OTHER): Payer: BC Managed Care – PPO | Admitting: Bariatrics

## 2021-04-18 ENCOUNTER — Other Ambulatory Visit: Payer: Self-pay

## 2021-04-18 ENCOUNTER — Encounter (INDEPENDENT_AMBULATORY_CARE_PROVIDER_SITE_OTHER): Payer: Self-pay | Admitting: Bariatrics

## 2021-04-18 VITALS — BP 120/82 | HR 79 | Temp 98.1°F | Ht 65.0 in | Wt 245.0 lb

## 2021-04-18 DIAGNOSIS — R7303 Prediabetes: Secondary | ICD-10-CM

## 2021-04-18 DIAGNOSIS — Z6841 Body Mass Index (BMI) 40.0 and over, adult: Secondary | ICD-10-CM

## 2021-04-18 DIAGNOSIS — E559 Vitamin D deficiency, unspecified: Secondary | ICD-10-CM

## 2021-04-18 MED ORDER — VITAMIN D (ERGOCALCIFEROL) 1.25 MG (50000 UNIT) PO CAPS: 50000.0000 [IU] | ORAL_CAPSULE | ORAL | 0 refills | Status: DC

## 2021-04-18 MED ORDER — PHENTERMINE-TOPIRAMATE ER 7.5-46 MG PO CP24: ORAL_CAPSULE | ORAL | 0 refills | Status: DC

## 2021-04-19 ENCOUNTER — Encounter (INDEPENDENT_AMBULATORY_CARE_PROVIDER_SITE_OTHER): Payer: Self-pay | Admitting: Bariatrics

## 2021-04-19 NOTE — Progress Notes (Signed)
Chief Complaint:   OBESITY Sara Stevenson is here to discuss her progress with her obesity treatment plan along with follow-up of her obesity related diagnoses. Sara Stevenson is on the Category 2 Plan and states she is following her eating plan approximately 50% of the time. Sara Stevenson states she is playing volleyball for 90 minutes 2 times per week.  Today's visit was #: 16 Starting weight: 289 lbs Starting date: 06/13/2020 Today's weight: 245 lbs Today's date: 04/18/2021 Total lbs lost to date: 44 lbs Total lbs lost since last in-office visit: 0  Interim History: Sara Stevenson is up 1 lb since her last visit but has done well overall. She has been to a lot of events. She is doing well with her protein and water.  Subjective:   1. Vitamin D deficiency Sara Stevenson is taking her prescription Vitamin D as directed.   2. Pre-diabetes Sara Stevenson is currently not on medications.   Assessment/Plan:   1. Vitamin D deficiency Low Vitamin D level contributes to fatigue and are associated with obesity, breast, and colon cancer. We will refill prescription Vitamin D 50,000 IU every week for 1 month with no refills and Sara Stevenson will follow-up for routine testing of Vitamin D, at least 2-3 times per year to avoid over-replacement.  - Vitamin D, Ergocalciferol, (DRISDOL) 1.25 MG (50000 UNIT) CAPS capsule; Take 1 capsule (50,000 Units total) by mouth every 7 (seven) days.  Dispense: 4 capsule; Refill: 0  2. Pre-diabetes Sara Stevenson will continue to work on weight loss, exercise, and decreasing simple carbohydrates to help decrease the risk of diabetes. She will increase fats and protein.  3. Obesity, current BMI 40.8 Sara Stevenson is currently in the action stage of change. As such, her goal is to continue with weight loss efforts. She has agreed to the Category 2 Plan.   Sara Stevenson will continue meal planning and intentional eating. We will refill Qsymia 7.5/46 mg for 1 month with no refills.   - Phentermine-Topiramate 7.5-46 MG CP24; 1  capsule daily  Dispense: 30 capsule; Refill: 0  Exercise goals:  As is.  Behavioral modification strategies: increasing lean protein intake, decreasing simple carbohydrates, increasing vegetables, increasing water intake, decreasing eating out, no skipping meals, meal planning and cooking strategies, keeping healthy foods in the home, and planning for success.  Glenis has agreed to follow-up with our clinic in 3 weeks. She was informed of the importance of frequent follow-up visits to maximize her success with intensive lifestyle modifications for her multiple health conditions.   Objective:   Blood pressure 120/82, pulse 79, temperature 98.1 F (36.7 C), height 5\' 5"  (1.651 m), weight 245 lb (111.1 kg), SpO2 100 %. Body mass index is 40.77 kg/m.  General: Cooperative, alert, well developed, in no acute distress. HEENT: Conjunctivae and lids unremarkable. Cardiovascular: Regular rhythm.  Lungs: Normal work of breathing. Neurologic: No focal deficits.   Lab Results  Component Value Date   CREATININE 0.95 03/06/2021   BUN 9 03/06/2021   NA 140 03/06/2021   K 4.0 03/06/2021   CL 108 (H) 03/06/2021   CO2 18 (L) 03/06/2021   Lab Results  Component Value Date   ALT 7 03/06/2021   AST 13 03/06/2021   ALKPHOS 127 (H) 03/06/2021   BILITOT <0.2 03/06/2021   Lab Results  Component Value Date   HGBA1C 6.2 (H) 03/06/2021   HGBA1C 6.0 (H) 10/10/2020   HGBA1C 6.0 (H) 05/02/2020   HGBA1C 6.1 (H) 03/27/2019   HGBA1C 5.9 (H) 03/14/2018   Lab Results  Component Value Date   INSULIN 13.8 03/06/2021   INSULIN 26.7 (H) 10/10/2020   INSULIN 32.5 (H) 06/13/2020   Lab Results  Component Value Date   TSH 2.030 05/02/2020   Lab Results  Component Value Date   CHOL 133 05/02/2020   HDL 43 05/02/2020   LDLCALC 74 05/02/2020   TRIG 79 05/02/2020   CHOLHDL 3.1 05/02/2020   Lab Results  Component Value Date   VD25OH 32.7 03/06/2021   VD25OH 28.6 (L) 10/10/2020   VD25OH 5.9 (L)  06/13/2020   Lab Results  Component Value Date   WBC 9.0 05/02/2020   HGB 11.5 05/02/2020   HCT 36.4 05/02/2020   MCV 81 05/02/2020   No results found for: IRON, TIBC, FERRITIN  Attestation Statements:   Reviewed by clinician on day of visit: allergies, medications, problem list, medical history, surgical history, family history, social history, and previous encounter notes.  I, Jackson Latino, RMA, am acting as Energy manager for [employer], DO.   I have reviewed the above documentation for accuracy and completeness, and I agree with the above. Corinna Capra, DO

## 2021-04-21 ENCOUNTER — Other Ambulatory Visit (HOSPITAL_COMMUNITY)
Admission: RE | Admit: 2021-04-21 | Discharge: 2021-04-21 | Disposition: A | Discharge status: Home / Self Care | Payer: BC Managed Care – PPO | Source: Ambulatory Visit | Attending: Obstetrics and Gynecology | Admitting: Obstetrics and Gynecology

## 2021-04-21 ENCOUNTER — Ambulatory Visit (INDEPENDENT_AMBULATORY_CARE_PROVIDER_SITE_OTHER): Payer: BC Managed Care – PPO | Admitting: Obstetrics and Gynecology

## 2021-04-21 ENCOUNTER — Encounter: Payer: Self-pay | Admitting: Obstetrics and Gynecology

## 2021-04-21 ENCOUNTER — Other Ambulatory Visit: Payer: Self-pay

## 2021-04-21 VITALS — BP 128/70 | Ht 66.0 in | Wt 244.0 lb

## 2021-04-21 DIAGNOSIS — N771 Vaginitis, vulvitis and vulvovaginitis in diseases classified elsewhere: Secondary | ICD-10-CM | POA: Diagnosis not present

## 2021-04-21 DIAGNOSIS — Z113 Encounter for screening for infections with a predominantly sexual mode of transmission: Secondary | ICD-10-CM | POA: Insufficient documentation

## 2021-04-21 DIAGNOSIS — Z01419 Encounter for gynecological examination (general) (routine) without abnormal findings: Secondary | ICD-10-CM

## 2021-04-21 DIAGNOSIS — R875 Abnormal microbiological findings in specimens from female genital organs: Secondary | ICD-10-CM | POA: Diagnosis not present

## 2021-04-21 DIAGNOSIS — Z124 Encounter for screening for malignant neoplasm of cervix: Secondary | ICD-10-CM

## 2021-04-21 DIAGNOSIS — Z3009 Encounter for other general counseling and advice on contraception: Secondary | ICD-10-CM

## 2021-04-21 DIAGNOSIS — Z0000 Encounter for general adult medical examination without abnormal findings: Secondary | ICD-10-CM

## 2021-04-21 MED ORDER — DESOGESTREL-ETHINYL ESTRADIOL 0.15-30 MG-MCG PO TABS: 1.0000 | ORAL_TABLET | Freq: Every day | ORAL | 3 refills | Status: DC

## 2021-04-21 NOTE — Progress Notes (Signed)
Gynecology Annual Exam  PCP: Pcp, No  Chief Complaint:  Chief Complaint  Patient presents with   Gynecologic Exam    History of Present Illness: Patient is a 30 y.o. G0P0000 presents for annual exam. The patient has no complaints today.   LMP: Patient's last menstrual period was 04/07/2021. Average Interval: monthly  Duration of flow: 4 days Heavy Menses: no Dysmenorrhea: no  She denies passage of large clots She denies sensations of gushing or flooding of blood. She denies accidents where she bleeds through her clothing. She denies that she changes a saturated pad or tampon more frequently than every hour.  She denies that pain from her periods limits her activities.  The patient does perform self breast exams.  There is no notable family history of breast or ovarian cancer in her family.  The patient reports her exercise generally consists of volley ball twice a week .  The patient denies current symptoms of depression.   PHQ-9: 0 GAD-7: 0   Review of Systems: Review of Systems  Constitutional:  Negative for chills, fever, malaise/fatigue and weight loss.  HENT:  Negative for congestion, hearing loss and sinus pain.   Eyes:  Negative for blurred vision and double vision.  Respiratory:  Negative for cough, sputum production, shortness of breath and wheezing.   Cardiovascular:  Negative for chest pain, palpitations, orthopnea and leg swelling.  Gastrointestinal:  Negative for abdominal pain, constipation, diarrhea, nausea and vomiting.  Genitourinary:  Negative for dysuria, flank pain, frequency, hematuria and urgency.  Musculoskeletal:  Negative for back pain, falls and joint pain.  Skin:  Negative for itching and rash.  Neurological:  Negative for dizziness and headaches.  Psychiatric/Behavioral:  Negative for depression, substance abuse and suicidal ideas. The patient is not nervous/anxious.    Past Medical History:  Past Medical History:  Diagnosis Date    Bacterial vaginitis    recurrrent   History of abnormal cervical Pap smear 2010; 03/20/13   POS HRHPV   Morbid obesity (HCC)    Obesity    Pre-diabetes    STD (sexually transmitted disease) 03/2012   chlamydia    Past Surgical History:  Past Surgical History:  Procedure Laterality Date   COLPOSCOPY  05/04/2013   negative   WISDOM TOOTH EXTRACTION      Gynecologic History:  Patient's last menstrual period was 04/07/2021. Menarche: 3rd grade Last Pap: Results were: 2019  She identifies as a female. She is sexually active with men.   She denies dyspareunia. She sometimes has light spotting or postcoital bleeding.  She currently uses condoms and OCP (estrogen/progesterone) for contraception.    Obstetric History: G0P0000  Family History:  Family History  Problem Relation Age of Onset   High blood pressure Mother    Diabetes Other    Heart disease Neg Hx    Hypertension Neg Hx    Cancer Neg Hx     Social History:  Social History   Socioeconomic History   Marital status: Single    Spouse name: Not on file   Number of children: 0   Years of education: 14   Highest education level: Not on file  Occupational History   Occupation: Sales executive   Occupation: CUSTOMER SERVICE    Comment: [employer]  Tobacco Use   Smoking status: Never   Smokeless tobacco: Never  Vaping Use   Vaping Use: Never used  Substance and Sexual Activity   Alcohol use: Yes    Comment: occasionally  Drug use: No   Sexual activity: Yes    Partners: Male    Birth control/protection: Pill  Other Topics Concern   Not on file  Social History Narrative   Not on file   Social Determinants of Health   Financial Resource Strain: Not on file  Food Insecurity: Not on file  Transportation Needs: Not on file  Physical Activity: Not on file  Stress: Not on file  Social Connections: Not on file  Intimate Partner Violence: Not on file    Allergies:  No Known  Allergies  Medications: Prior to Admission medications   Medication Sig Start Date End Date Taking? Authorizing Provider  cetirizine (ZYRTEC) 10 MG tablet Take 10 mg by mouth daily.    [provider]  ISIBLOOM 0.15-30 MG-MCG tablet TAKE 1 TABLET BY MOUTH DAILY 08/22/20   Homero Fellers, MD  Phentermine-Topiramate 7.5-46 MG CP24 1 capsule daily 04/18/21   Jearld Lesch A, DO  Vitamin D, Ergocalciferol, (DRISDOL) 1.25 MG (50000 UNIT) CAPS capsule Take 1 capsule (50,000 Units total) by mouth every 7 (seven) days. 04/18/21   Georgia Lopes, DO    Physical Exam Vitals: Blood pressure 128/70, height 5\' 6"  (1.676 m), weight 244 lb (110.7 kg), last menstrual period 04/07/2021.  Physical Exam Constitutional:      Appearance: She is well-developed.  Genitourinary:     Genitourinary Comments: External: Normal appearing vulva. No lesions noted.  Speculum examination: Normal appearing cervix. No blood in the vaginal vault. Thin white discharge.    Bimanual examination: Uterus midline, non-tender, normal in size, shape and contour.  No CMT. No adnexal masses. No adnexal tenderness. Pelvis not fixed.  Breast Exam: breast equal without skin changes, nipple discharge, breast lump or enlarged lymph nodes   HENT:     Head: Normocephalic and atraumatic.  Neck:     Thyroid: No thyromegaly.  Cardiovascular:     Rate and Rhythm: Normal rate and regular rhythm.     Heart sounds: Normal heart sounds.  Pulmonary:     Effort: Pulmonary effort is normal.     Breath sounds: Normal breath sounds.  Abdominal:     General: Bowel sounds are normal. There is no distension.     Palpations: Abdomen is soft. There is no mass.  Musculoskeletal:     Cervical back: Neck supple.  Neurological:     Mental Status: She is alert and oriented to person, place, and time.  Skin:    General: Skin is warm and dry.  Psychiatric:        Behavior: Behavior normal.        Thought Content: Thought content normal.         Judgment: Judgment normal.  Vitals reviewed.     Female chaperone present for pelvic and breast  portions of the physical exam  Assessment: 30 y.o. G0P0000 routine annual exam  Plan: Problem List Items Addressed This Visit       Other   Contraceptive management   Relevant Medications   desogestrel-ethinyl estradiol (ISIBLOOM) 0.15-30 MG-MCG tablet   Other Visit Diagnoses     Encounter for annual routine gynecological examination    -  Primary   Cervical cancer screening       Relevant Orders   Cytology - PAP   Abnormal microbiological finding in specimen from female genital organ       Relevant Orders   NuSwab BV and Candida, NAA   Vulvitis in disease classified elsewhere  Relevant Orders   NuSwab BV and Candida, NAA   Screen for STD (sexually transmitted disease)       Relevant Orders   HEP, RPR, HIV Panel   Health maintenance examination           1) STI screening was offered and accepted  2) Pap smear today  3) Contraception - rx for OCP sent  4) Routine healthcare maintenance including cholesterol, diabetes screening discussed managed by PCP   Adrian Prows MD, [place], [facility] 04/21/2021 9:23 AM

## 2021-04-21 NOTE — Patient Instructions (Signed)
Institute of Medicine Recommended Dietary Allowances for Calcium and Vitamin D  Age (yr) Calcium Recommended Dietary Allowance (mg/day) Vitamin D Recommended Dietary Allowance (international units/day)  9-18 1,300 600  19-50 1,000 600  51-70 1,200 600  71 and older 1,200 800  Data from Institute of Medicine. Dietary reference intakes: calcium, vitamin D. [place], [place]: National Academies Press; 2011.   Exercising to Stay Healthy To become healthy and stay healthy, it is recommended that you do moderate-intensity and vigorous-intensity exercise. You can tell that you are exercising at a moderate intensity if your heart starts beating faster and you start breathing faster but can still hold a conversation. You can tell that you are exercising at a vigorous intensity if you are breathing much harder and faster and cannot hold a conversation while exercising. How can exercise benefit me? Exercising regularly is important. It has many health benefits, such as: Improving overall fitness, flexibility, and endurance. Increasing bone density. Helping with weight control. Decreasing body fat. Increasing muscle strength and endurance. Reducing stress and tension, anxiety, depression, or anger. Improving overall health. What guidelines should I follow while exercising? Before you start a new exercise program, talk with your health care provider. Do not exercise so much that you hurt yourself, feel dizzy, or get very short of breath. Wear comfortable clothes and wear shoes with good support. Drink plenty of water while you exercise to prevent dehydration or heat stroke. Work out until your breathing and your heartbeat get faster (moderate intensity). How often should I exercise? Choose an activity that you enjoy, and set realistic goals. Your health care provider can help you make an activity plan that is individually designed and works best for you. Exercise regularly as told by your health  care provider. This may include: Doing strength training two times a week, such as: Lifting weights. Using resistance bands. Push-ups. Sit-ups. Yoga. Doing a certain intensity of exercise for a given amount of time. Choose from these options: A total of 150 minutes of moderate-intensity exercise every week. A total of 75 minutes of vigorous-intensity exercise every week. A mix of moderate-intensity and vigorous-intensity exercise every week. Children, pregnant women, people who have not exercised regularly, people who are overweight, and older adults may need to talk with a health care provider about what activities are safe to perform. If you have a medical condition, be sure to talk with your health care provider before you start a new exercise program. What are some exercise ideas? Moderate-intensity exercise ideas include: Walking 1 mile (1.6 km) in about 15 minutes. Biking. Hiking. Golfing. Dancing. Water aerobics. Vigorous-intensity exercise ideas include: Walking 4.5 miles (7.2 km) or more in about 1 hour. Jogging or running 5 miles (8 km) in about 1 hour. Biking 10 miles (16.1 km) or more in about 1 hour. Lap swimming. Roller-skating or in-line skating. Cross-country skiing. Vigorous competitive sports, such as football, basketball, and soccer. Jumping rope. Aerobic dancing. What are some everyday activities that can help me get exercise? Yard work, such as: Pushing a lawn mower. Raking and bagging leaves. Washing your car. Pushing a stroller. Shoveling snow. Gardening. Washing windows or floors. How can I be more active in my day-to-day activities? Use stairs instead of an elevator. Take a walk during your lunch break. If you drive, park your car farther away from your work or school. If you take public transportation, get off one stop early and walk the rest of the way. Stand up or walk around during all of   your indoor phone calls. Get up, stretch, and walk  around every 30 minutes throughout the day. Enjoy exercise with a friend. Support to continue exercising will help you keep a regular routine of activity. Where to find more information You can find more information about exercising to stay healthy from: U.S. Department of Health and Human Services: www.hhs.gov Centers for Disease Control and Prevention (CDC): www.cdc.gov Summary Exercising regularly is important. It will improve your overall fitness, flexibility, and endurance. Regular exercise will also improve your overall health. It can help you control your weight, reduce stress, and improve your bone density. Do not exercise so much that you hurt yourself, feel dizzy, or get very short of breath. Before you start a new exercise program, talk with your health care provider. This information is not intended to replace advice given to you by your health care provider. Make sure you discuss any questions you have with your health care provider. Document Revised: 09/23/2020 Document Reviewed: 09/23/2020 Elsevier Patient Education  2022 Elsevier Inc. Budget-Friendly Healthy Eating There are many ways to save money at the grocery store and continue to eat healthy. You can be successful if you: Plan meals according to your budget. Make a grocery list and only purchase food according to your grocery list. Prepare food yourself at home. What are tips for following this plan? Reading food labels Compare food labels between brand name foods and the store brand. Often the nutritional value is the same, but the store brand is lower cost. Look for products that do not have added sugar, fat, or salt (sodium). These often cost the same but are healthier for you. Products may be labeled as: Sugar-free. Nonfat. Low-fat. Sodium-free. Low-sodium. Look for lean ground beef labeled as at least 92% lean and 8% fat. Shopping  Buy only the items on your grocery list and go only to the areas of the store  that have the items on your list. Use coupons only for foods and brands you normally buy. Avoid buying items you wouldn't normally buy simply because they are on sale. Check online and in newspapers for weekly deals. Buy healthy items from the bulk bins when available, such as herbs, spices, flour, pasta, nuts, and dried fruit. Buy fruits and vegetables that are in season. Prices are usually lower on in-season produce. Look at the unit price on the price tag. Use it to compare different brands and sizes to find out which item is the best deal. Choose healthy items that are often low-cost, such as carrots, potatoes, apples, bananas, and oranges. Dried or canned beans are a low-cost protein source. Buy in bulk and freeze extra food. Items you can buy in bulk include meats, fish, poultry, frozen fruits, and frozen vegetables. Avoid buying "ready-to-eat" foods, such as pre-cut fruits and vegetables and pre-made salads. If possible, shop around to discover where you can find the best prices. Consider other retailers such as dollar stores, larger wholesale stores, local fruit and vegetable stands, and farmers markets. Do not shop when you are hungry. If you shop while hungry, it may be hard to stick to your list and budget. Resist impulse buying. Use your grocery list as your official plan for the week. Buy a variety of vegetables and fruits by purchasing fresh, frozen, and canned items. Look at the top and bottom shelves for deals. Foods at eye level (eye level of an adult or child) are usually more expensive. Be efficient with your time when shopping. The more time you   spend at the store, the more money you are likely to spend. To save money when choosing more expensive foods like meats and dairy: Choose cheaper cuts of meat, such as bone-in chicken thighs and drumsticks instead of skinless and boneless chicken. When you are ready to prepare the chicken, you can remove the skin yourself to make it  healthier. Choose lean meats like chicken or [place] instead of beef. Choose canned seafood, such as tuna, salmon, or sardines. Buy eggs as a low-cost source of protein. Buy dried beans and peas, such as lentils, split peas, or kidney beans instead of meats. Dried beans and peas are a good alternative source of protein. Buy the larger tubs of yogurt instead of individual-sized containers. Choose water instead of sodas and other sweetened beverages. Avoid buying chips, cookies, and other "junk food." These items are usually expensive and not healthy. Cooking Make extra food and freeze the extras in meal-sized containers or in individual portions for fast meals and snacks. Pre-cook on days when you have extra time to prepare meals in advance. You can keep these meals in the fridge or freezer and reheat for a quick meal. When you come home from the grocery store, wash, peel, and cut fruits and vegetables so they are ready to use and eat. This will help reduce food waste. Meal planning Do not eat out or get fast food. Prepare food at home. Make a grocery list and make sure to bring it with you to the store. If you have a smart phone, you could use your phone to create your shopping list. Plan meals and snacks according to a grocery list and budget you create. Use leftovers in your meal plan for the week. Look for recipes where you can cook once and make enough food for two meals. Prepare budget-friendly types of meals like stews, casseroles, and stir-fry dishes. Try some meatless meals or try "no cook" meals like salads. Make sure that half your plate is filled with fruits or vegetables. Choose from fresh, frozen, or canned fruits and vegetables. If eating canned, remember to rinse them before eating. This will remove any excess salt added for packaging. Summary Eating healthy on a budget is possible if you plan your meals according to your budget, purchase according to your budget and grocery list,  and prepare food yourself. Tips for buying more food on a limited budget include buying generic brands, using coupons only for foods you normally buy, and buying healthy items from the bulk bins when available. Tips for buying cheaper food to replace expensive food include choosing cheaper, lean cuts of meat, and buying dried beans and peas. This information is not intended to replace advice given to you by your health care provider. Make sure you discuss any questions you have with your health care provider. Document Revised: 03/10/2020 Document Reviewed: 03/10/2020 Elsevier Patient Education  2022 [employer].

## 2021-04-22 LAB — HEP, RPR, HIV PANEL
HIV Screen 4th Generation wRfx: NONREACTIVE
Hepatitis B Surface Ag: NEGATIVE
RPR Ser Ql: NONREACTIVE

## 2021-04-23 LAB — NUSWAB BV AND CANDIDA, NAA
Candida albicans, NAA: NEGATIVE
Candida glabrata, NAA: NEGATIVE

## 2021-04-26 LAB — CYTOLOGY - PAP
Chlamydia: NEGATIVE
Comment: NEGATIVE
Comment: NEGATIVE
Comment: NEGATIVE
Comment: NORMAL
Diagnosis: NEGATIVE
Diagnosis: REACTIVE
High risk HPV: NEGATIVE
Neisseria Gonorrhea: NEGATIVE
Trichomonas: NEGATIVE

## 2021-05-10 ENCOUNTER — Encounter (INDEPENDENT_AMBULATORY_CARE_PROVIDER_SITE_OTHER): Payer: Self-pay | Admitting: Bariatrics

## 2021-05-10 ENCOUNTER — Telehealth (INDEPENDENT_AMBULATORY_CARE_PROVIDER_SITE_OTHER): Payer: BC Managed Care – PPO | Admitting: Bariatrics

## 2021-05-10 DIAGNOSIS — E559 Vitamin D deficiency, unspecified: Secondary | ICD-10-CM | POA: Diagnosis not present

## 2021-05-10 DIAGNOSIS — R7303 Prediabetes: Secondary | ICD-10-CM | POA: Diagnosis not present

## 2021-05-10 DIAGNOSIS — Z6841 Body Mass Index (BMI) 40.0 and over, adult: Secondary | ICD-10-CM

## 2021-05-15 NOTE — Progress Notes (Signed)
TeleHealth Visit:  Due to the COVID-19 pandemic, this visit was completed with telemedicine (audio/video) technology to reduce patient and provider exposure as well as to preserve personal protective equipment.   Sara Stevenson has verbally consented to this TeleHealth visit. The patient is located at home, the provider is located at the [facility] office. The participants in this visit include the listed provider and patient. The visit was conducted today via video.  Chief Complaint: OBESITY Sara Stevenson is here to discuss her progress with her obesity treatment plan along with follow-up of her obesity related diagnoses. Sara Stevenson is on the Category 2 Plan and states she is following her eating plan approximately 40% of the time. Sara Stevenson states she is doing 0 minutes 0 times per week.  Today's visit was #: 17 Starting weight: 289 lbs Starting date: 06/13/2020  Interim History: Sara Stevenson states that she has a cough and stuffy nose and requested a video visit. She is unsure if she has lost weight.   Subjective:   1. Pre-diabetes Sara Stevenson is currently not on medications.  2. Vitamin D deficiency Sara Stevenson is taking Vitamin D currently.  Assessment/Plan:   1. Pre-diabetes Sara Stevenson will continue to work on weight loss, exercise, and decreasing simple carbohydrates to help decrease the risk of diabetes. She will increase healthy fats and protein,  2. Vitamin D deficiency Low Vitamin D level contributes to fatigue and are associated with obesity, breast, and colon cancer. Sara Stevenson agrees to continue to take prescription Vitamin D 50,000 IU every week and she will follow-up for routine testing of Vitamin D, at least 2-3 times per year to avoid over-replacement.  3. Obesity, current BMI 40.8 Sara Stevenson is currently in the action stage of change. As such, her goal is to continue with weight loss efforts. She has agreed to the Category 2 Plan.   Sara Stevenson will do meal planning and she will continue mindful  eating. She will adhere closely to the plan. She will have 1200 calories and 80 grams of protein.  Exercise goals:  Sara Stevenson will decrease exercise.  Behavioral modification strategies: increasing lean protein intake, decreasing simple carbohydrates, increasing vegetables, increasing water intake, decreasing eating out, no skipping meals, meal planning and cooking strategies, keeping healthy foods in the home, and planning for success.  Sara Stevenson has agreed to follow-up with our clinic in 3 weeks with [name], FNP or William Hamburger, NP and 6 weeks with myself. She was informed of the importance of frequent follow-up visits to maximize her success with intensive lifestyle modifications for her multiple health conditions.  Objective:   VITALS: Per patient if applicable, see vitals. GENERAL: Alert and in no acute distress. CARDIOPULMONARY: No increased WOB. Speaking in clear sentences.  PSYCH: Pleasant and cooperative. Speech normal rate and rhythm. Affect is appropriate. Insight and judgement are appropriate. Attention is focused, linear, and appropriate.  NEURO: Oriented as arrived to appointment on time with no prompting.   Lab Results  Component Value Date   CREATININE 0.95 03/06/2021   BUN 9 03/06/2021   NA 140 03/06/2021   K 4.0 03/06/2021   CL 108 (H) 03/06/2021   CO2 18 (L) 03/06/2021   Lab Results  Component Value Date   ALT 7 03/06/2021   AST 13 03/06/2021   ALKPHOS 127 (H) 03/06/2021   BILITOT <0.2 03/06/2021   Lab Results  Component Value Date   HGBA1C 6.2 (H) 03/06/2021   HGBA1C 6.0 (H) 10/10/2020   HGBA1C 6.0 (H) 05/02/2020   HGBA1C 6.1 (  H) 03/27/2019   HGBA1C 5.9 (H) 03/14/2018   Lab Results  Component Value Date   INSULIN 13.8 03/06/2021   INSULIN 26.7 (H) 10/10/2020   INSULIN 32.5 (H) 06/13/2020   Lab Results  Component Value Date   TSH 2.030 05/02/2020   Lab Results  Component Value Date   CHOL 133 05/02/2020   HDL 43 05/02/2020   LDLCALC 74  05/02/2020   TRIG 79 05/02/2020   CHOLHDL 3.1 05/02/2020   Lab Results  Component Value Date   VD25OH 32.7 03/06/2021   VD25OH 28.6 (L) 10/10/2020   VD25OH 5.9 (L) 06/13/2020   Lab Results  Component Value Date   WBC 9.0 05/02/2020   HGB 11.5 05/02/2020   HCT 36.4 05/02/2020   MCV 81 05/02/2020   No results found for: IRON, TIBC, FERRITIN  Attestation Statements:   Reviewed by clinician on day of visit: allergies, medications, problem list, medical history, surgical history, family history, social history, and previous encounter notes.  I, Jackson Latino, RMA, am acting as Energy manager for [employer], DO.  I have reviewed the above documentation for accuracy and completeness, and I agree with the above. Corinna Capra, DO

## 2021-05-24 ENCOUNTER — Other Ambulatory Visit (INDEPENDENT_AMBULATORY_CARE_PROVIDER_SITE_OTHER): Payer: Self-pay | Admitting: Bariatrics

## 2021-05-24 DIAGNOSIS — E559 Vitamin D deficiency, unspecified: Secondary | ICD-10-CM

## 2021-05-24 NOTE — Telephone Encounter (Signed)
LAST APPOINTMENT DATE: 04/18/21 NEXT APPOINTMENT DATE: 06/01/21   [id number] [place], [place] - [address] AT [facility] [address] [place] [address] Phone: [phone] Fax: [phone]  Patient is requesting a refill of the following medications: Requested Prescriptions   Pending Prescriptions Disp Refills   Vitamin D, Ergocalciferol, (DRISDOL) 1.25 MG (50000 UNIT) CAPS capsule [Pharmacy Med Name: VITAMIN D2 50,000IU (ERGO) CAP RX] 4 capsule 0    Sig: TAKE 1 CAPSULE BY MOUTH EVERY 7 DAYS    Date last filled: 04/18/21 Previously prescribed by Dr. Manson Passey  Lab Results  Component Value Date   HGBA1C 6.2 (H) 03/06/2021   HGBA1C 6.0 (H) 10/10/2020   HGBA1C 6.0 (H) 05/02/2020   Lab Results  Component Value Date   LDLCALC 74 05/02/2020   CREATININE 0.95 03/06/2021   Lab Results  Component Value Date   VD25OH 32.7 03/06/2021   VD25OH 28.6 (L) 10/10/2020   VD25OH 5.9 (L) 06/13/2020    BP Readings from Last 3 Encounters:  04/21/21 128/70  04/18/21 120/82  03/28/21 104/68

## 2021-05-30 ENCOUNTER — Encounter (INDEPENDENT_AMBULATORY_CARE_PROVIDER_SITE_OTHER): Payer: Self-pay | Admitting: Bariatrics

## 2021-06-01 ENCOUNTER — Other Ambulatory Visit: Payer: Self-pay

## 2021-06-01 ENCOUNTER — Encounter (INDEPENDENT_AMBULATORY_CARE_PROVIDER_SITE_OTHER): Payer: Self-pay | Admitting: Family Medicine

## 2021-06-01 ENCOUNTER — Ambulatory Visit (INDEPENDENT_AMBULATORY_CARE_PROVIDER_SITE_OTHER): Payer: BC Managed Care – PPO | Admitting: Family Medicine

## 2021-06-01 VITALS — BP 119/82 | HR 84 | Temp 98.0°F | Ht 65.0 in | Wt 244.0 lb

## 2021-06-01 DIAGNOSIS — Z9189 Other specified personal risk factors, not elsewhere classified: Secondary | ICD-10-CM

## 2021-06-01 DIAGNOSIS — E559 Vitamin D deficiency, unspecified: Secondary | ICD-10-CM | POA: Diagnosis not present

## 2021-06-01 DIAGNOSIS — Z6841 Body Mass Index (BMI) 40.0 and over, adult: Secondary | ICD-10-CM | POA: Diagnosis not present

## 2021-06-01 DIAGNOSIS — R632 Polyphagia: Secondary | ICD-10-CM

## 2021-06-01 MED ORDER — VITAMIN D (ERGOCALCIFEROL) 1.25 MG (50000 UNIT) PO CAPS: 50000.0000 [IU] | ORAL_CAPSULE | ORAL | 0 refills | Status: DC

## 2021-06-01 MED ORDER — PHENTERMINE-TOPIRAMATE ER 7.5-46 MG PO CP24: ORAL_CAPSULE | ORAL | 0 refills | Status: DC

## 2021-06-01 NOTE — Progress Notes (Signed)
Chief Complaint:   OBESITY Sara Stevenson is here to discuss her progress with her obesity treatment plan along with follow-up of her obesity related diagnoses. Sara Stevenson is on the Category 2 Plan or keeping a food journal and adhering to recommended goals of 1200 calories and 80 grams of protein daily and states she is following her eating plan approximately 30% of the time. Sara Stevenson states she is not currently exercising.  Today's visit was #: 18 Starting weight: 289 lbs Starting date: 06/13/2020 Today's weight: 244 lbs Today's date: 06/01/2021 Total lbs lost to date: 45 Total lbs lost since last in-office visit: 1  Interim History: Sara Stevenson is a patient of Dr. Manson Passey and this is her first office visit with me. She doesn't care for journaling, as she has no time right now. She has no issues with the plan.  Subjective:   1. Polyphagia Sara Stevenson is doing less snacking and has less hunger during the day on Qsymia. She is sleeping well.  2. Vitamin D deficiency Sara Stevenson is currently taking prescription vitamin D 50,000 IU each week. She denies nausea, vomiting or muscle weakness.  3. At risk for depression Sara Stevenson is at elevated risk of depression due to one or more of the following: family history, significant life stressors, medical conditions and/or poor nutrition.  Assessment/Plan:  No orders of the defined types were placed in this encounter.   Medications Discontinued During This Encounter  Medication Reason   Phentermine-Topiramate 7.5-46 MG CP24 Reorder   Vitamin D, Ergocalciferol, (DRISDOL) 1.25 MG (50000 UNIT) CAPS capsule Reorder     Meds ordered this encounter  Medications   Phentermine-Topiramate 7.5-46 MG CP24    Sig: 1 capsule daily    Dispense:  30 capsule    Refill:  0   Vitamin D, Ergocalciferol, (DRISDOL) 1.25 MG (50000 UNIT) CAPS capsule    Sig: Take 1 capsule (50,000 Units total) by mouth every 7 (seven) days.    Dispense:  4 capsule    Refill:  0     1.  Polyphagia Intensive lifestyle modifications are the first line treatment for this issue. We discussed several lifestyle modifications today. We will refill Qsymia for 1 month with no change in dose. Sara Stevenson will continue to work on diet, exercise and weight loss efforts. Orders and follow up as documented in patient record.  Counseling Polyphagia is excessive hunger. Causes can include: low blood sugars, hypERthyroidism, PMS, lack of sleep, stress, insulin resistance, diabetes, certain medications, and diets that are deficient in protein and fiber.   - Phentermine-Topiramate 7.5-46 MG CP24; 1 capsule daily  Dispense: 30 capsule; Refill: 0  2. Vitamin D deficiency Low Vitamin D level contributes to fatigue and are associated with obesity, breast, and colon cancer. We will refill prescription Vitamin D for 1 month. Sara Stevenson will follow-up for routine testing of Vitamin D, at least 2-3 times per year to avoid over-replacement.  - Vitamin D, Ergocalciferol, (DRISDOL) 1.25 MG (50000 UNIT) CAPS capsule; Take 1 capsule (50,000 Units total) by mouth every 7 (seven) days.  Dispense: 4 capsule; Refill: 0  3. At risk for depression Sara Stevenson was given approximately 8 minutes of depression risk counseling today. She has risk factors for depression including stress. We discussed the importance of a healthy work life balance, a healthy relationship with food and a good support system.  Repetitive spaced learning was employed today to elicit superior memory formation and behavioral change.  4. Obesity with current BMI of 40.6 Sara Stevenson is currently in  the action stage of change. As such, her goal is to continue with weight loss efforts. She has agreed to the Category 2 Plan or keeping a food journal and adhering to recommended goals of 1200 calories and 80 grams of protein daily.   Exercise goals: All adults should avoid inactivity. Some physical activity is better than none, and adults who participate in any amount  of physical activity gain some health benefits.  Behavioral modification strategies: increasing lean protein intake, decreasing simple carbohydrates, holiday eating strategies , and celebration eating strategies.  Sara Stevenson has agreed to follow-up with our clinic in 3 to 4 weeks. She was informed of the importance of frequent follow-up visits to maximize her success with intensive lifestyle modifications for her multiple health conditions.   Objective:   Blood pressure 119/82, pulse 84, temperature 98 F (36.7 C), height 5\' 5"  (1.651 m), weight 244 lb (110.7 kg), SpO2 99 %. Body mass index is 40.6 kg/m.  General: Cooperative, alert, well developed, in no acute distress. HEENT: Conjunctivae and lids unremarkable. Cardiovascular: Regular rhythm.  Lungs: Normal work of breathing. Neurologic: No focal deficits.   Lab Results  Component Value Date   CREATININE 0.95 03/06/2021   BUN 9 03/06/2021   NA 140 03/06/2021   K 4.0 03/06/2021   CL 108 (H) 03/06/2021   CO2 18 (L) 03/06/2021   Lab Results  Component Value Date   ALT 7 03/06/2021   AST 13 03/06/2021   ALKPHOS 127 (H) 03/06/2021   BILITOT <0.2 03/06/2021   Lab Results  Component Value Date   HGBA1C 6.2 (H) 03/06/2021   HGBA1C 6.0 (H) 10/10/2020   HGBA1C 6.0 (H) 05/02/2020   HGBA1C 6.1 (H) 03/27/2019   HGBA1C 5.9 (H) 03/14/2018   Lab Results  Component Value Date   INSULIN 13.8 03/06/2021   INSULIN 26.7 (H) 10/10/2020   INSULIN 32.5 (H) 06/13/2020   Lab Results  Component Value Date   TSH 2.030 05/02/2020   Lab Results  Component Value Date   CHOL 133 05/02/2020   HDL 43 05/02/2020   LDLCALC 74 05/02/2020   TRIG 79 05/02/2020   CHOLHDL 3.1 05/02/2020   Lab Results  Component Value Date   VD25OH 32.7 03/06/2021   VD25OH 28.6 (L) 10/10/2020   VD25OH 5.9 (L) 06/13/2020   Lab Results  Component Value Date   WBC 9.0 05/02/2020   HGB 11.5 05/02/2020   HCT 36.4 05/02/2020   MCV 81 05/02/2020   No results  found for: IRON, TIBC, FERRITIN  Attestation Statements:   Reviewed by clinician on day of visit: allergies, medications, problem list, medical history, surgical history, family history, social history, and previous encounter notes.   05/04/2020, am acting as transcriptionist for Trude Mcburney, DO.  I have reviewed the above documentation for accuracy and completeness, and I agree with the above. [employer], D.O.  The 21st Century Cures Act was signed into law in 2016 which includes the topic of electronic health records.  This provides immediate access to information in MyChart.  This includes consultation notes, operative notes, office notes, lab results and pathology reports.  If you have any questions about what you read please let 2017 know at your next visit so we can discuss your concerns and take corrective action if need be.  We are right here with you.

## 2021-06-07 ENCOUNTER — Encounter: Payer: Self-pay | Admitting: Obstetrics and Gynecology

## 2021-06-09 NOTE — Telephone Encounter (Signed)
Hi Sara Stevenson,  From what I see in your chart your Nuswab and STD testing blood work went to [facility].   [place] [place] received your pap smear.   This is routine in our office to use labs in the way.    Hope that answers your question.  Sincerely,  - Dr. Jerene Pitch

## 2021-06-20 ENCOUNTER — Telehealth: Payer: Self-pay

## 2021-06-26 NOTE — Telephone Encounter (Signed)
Pt calling again b/c she had no response from her call last week; was seen on 04/21/21. The services were charged and outpatient and not preventative.  They need to be charged as preventative.  Please call pt back to discuss.  [phone]

## 2021-06-27 ENCOUNTER — Encounter (INDEPENDENT_AMBULATORY_CARE_PROVIDER_SITE_OTHER): Payer: Self-pay | Admitting: Bariatrics

## 2021-06-27 ENCOUNTER — Ambulatory Visit (INDEPENDENT_AMBULATORY_CARE_PROVIDER_SITE_OTHER): Payer: BC Managed Care – PPO | Admitting: Bariatrics

## 2021-06-27 ENCOUNTER — Other Ambulatory Visit: Payer: Self-pay

## 2021-06-27 VITALS — BP 135/82 | HR 86 | Temp 98.4°F | Ht 65.0 in | Wt 246.0 lb

## 2021-06-27 DIAGNOSIS — Z30011 Encounter for initial prescription of contraceptive pills: Secondary | ICD-10-CM | POA: Diagnosis not present

## 2021-06-27 DIAGNOSIS — R7303 Prediabetes: Secondary | ICD-10-CM | POA: Diagnosis not present

## 2021-06-27 DIAGNOSIS — E559 Vitamin D deficiency, unspecified: Secondary | ICD-10-CM | POA: Diagnosis not present

## 2021-06-27 DIAGNOSIS — Z6841 Body Mass Index (BMI) 40.0 and over, adult: Secondary | ICD-10-CM

## 2021-06-27 DIAGNOSIS — Z3009 Encounter for other general counseling and advice on contraception: Secondary | ICD-10-CM

## 2021-06-27 MED ORDER — DESOGESTREL-ETHINYL ESTRADIOL 0.15-30 MG-MCG PO TABS: 1.0000 | ORAL_TABLET | Freq: Every day | ORAL | 3 refills | Status: AC

## 2021-06-27 MED ORDER — VITAMIN D (ERGOCALCIFEROL) 1.25 MG (50000 UNIT) PO CAPS: 50000.0000 [IU] | ORAL_CAPSULE | ORAL | 0 refills | Status: DC

## 2021-06-27 NOTE — Progress Notes (Signed)
Chief Complaint:   OBESITY Sara Stevenson is here to discuss her progress with her obesity treatment plan along with follow-up of her obesity related diagnoses. Sara Stevenson is on the Category 2 Plan and states she is following her eating plan approximately 40% of the time. Sara Stevenson states she is doing 0 minutes 0 times per week.  Today's visit was #: 19 Starting weight: 289 lbs Starting date: 06/13/2020 Today's weight: 246 lbs Today's date: 06/27/2021 Total lbs lost to date: 43 lbs Total lbs lost since last in-office visit: 0  Interim History: [name] is up 2 lbs since her last visit. She sometimes skips dinner.  Subjective:   1. Vitamin D deficiency Sara Stevenson is taking Vitamin D as directed.  2. Encounter for other general counseling or advice on contraception Sara Stevenson is taking her medication as directed.   3. Pre-diabetes Sara Stevenson is currently taking Qsymia.  Assessment/Plan:   1. Vitamin D deficiency Low Vitamin D level contributes to fatigue and are associated with obesity, breast, and colon cancer. We will refill prescription Vitamin D 50,000 IU every week for 1 month with no refills and Sara Stevenson will follow-up for routine testing of Vitamin D, at least 2-3 times per year to avoid over-replacement.  - Vitamin D, Ergocalciferol, (DRISDOL) 1.25 MG (50000 UNIT) CAPS capsule; Take 1 capsule (50,000 Units total) by mouth every 7 (seven) days.  Dispense: 4 capsule; Refill: 0  2. Encounter for other general counseling or advice on contraception We will refill Isibloom 0.15-30 mg-mcg for 3 months with 0 refills.   - desogestrel-ethinyl estradiol (ISIBLOOM) 0.15-30 MG-MCG tablet; Take 1 tablet by mouth daily.  Dispense: 90 tablet; Refill: 3  3. Pre-diabetes Sara Stevenson will continue to work on weight loss, exercise, and decreasing simple carbohydrates to help decrease the risk of diabetes. She will increase healthy fats and protein.  4. Obesity with current BMI of 41.1 Sara Stevenson is currently in the action  stage of change. As such, her goal is to continue with weight loss efforts. She has agreed to the Category 2 Plan and keeping a food journal and adhering to recommended goals of 1200 calories and 80 grams of protein.   Sara Stevenson will adhere more closely to the plan. She will increase her protein intake.  Exercise goals:  Sara Stevenson will get a gym membership.  Behavioral modification strategies: increasing lean protein intake, decreasing simple carbohydrates, increasing vegetables, increasing water intake, decreasing eating out, no skipping meals, meal planning and cooking strategies, keeping healthy foods in the home, and planning for success.  Sara Stevenson has agreed to follow-up with our clinic in 3-4 weeks with labs. She was informed of the importance of frequent follow-up visits to maximize her success with intensive lifestyle modifications for her multiple health conditions.   Objective:   Blood pressure 135/82, pulse 86, temperature 98.4 F (36.9 C), height 5\' 5"  (1.651 m), weight 246 lb (111.6 kg), SpO2 100 %. Body mass index is 40.94 kg/m.  General: Cooperative, alert, well developed, in no acute distress. HEENT: Conjunctivae and lids unremarkable. Cardiovascular: Regular rhythm.  Lungs: Normal work of breathing. Neurologic: No focal deficits.   Lab Results  Component Value Date   CREATININE 0.95 03/06/2021   BUN 9 03/06/2021   NA 140 03/06/2021   K 4.0 03/06/2021   CL 108 (H) 03/06/2021   CO2 18 (L) 03/06/2021   Lab Results  Component Value Date   ALT 7 03/06/2021   AST 13 03/06/2021   ALKPHOS 127 (H) 03/06/2021   BILITOT <0.2  03/06/2021   Lab Results  Component Value Date   HGBA1C 6.2 (H) 03/06/2021   HGBA1C 6.0 (H) 10/10/2020   HGBA1C 6.0 (H) 05/02/2020   HGBA1C 6.1 (H) 03/27/2019   HGBA1C 5.9 (H) 03/14/2018   Lab Results  Component Value Date   INSULIN 13.8 03/06/2021   INSULIN 26.7 (H) 10/10/2020   INSULIN 32.5 (H) 06/13/2020   Lab Results  Component Value Date    TSH 2.030 05/02/2020   Lab Results  Component Value Date   CHOL 133 05/02/2020   HDL 43 05/02/2020   LDLCALC 74 05/02/2020   TRIG 79 05/02/2020   CHOLHDL 3.1 05/02/2020   Lab Results  Component Value Date   VD25OH 32.7 03/06/2021   VD25OH 28.6 (L) 10/10/2020   VD25OH 5.9 (L) 06/13/2020   Lab Results  Component Value Date   WBC 9.0 05/02/2020   HGB 11.5 05/02/2020   HCT 36.4 05/02/2020   MCV 81 05/02/2020   No results found for: IRON, TIBC, FERRITIN  Attestation Statements:   Reviewed by clinician on day of visit: allergies, medications, problem list, medical history, surgical history, family history, social history, and previous encounter notes.  I, Jackson Latino, RMA, am acting as Energy manager for [employer], DO.  I have reviewed the above documentation for accuracy and completeness, and I agree with the above. Corinna Capra, DO

## 2021-06-28 ENCOUNTER — Encounter (INDEPENDENT_AMBULATORY_CARE_PROVIDER_SITE_OTHER): Payer: Self-pay | Admitting: Bariatrics

## 2021-07-05 NOTE — Telephone Encounter (Signed)
Left message to return call 

## 2021-07-05 NOTE — Telephone Encounter (Signed)
I don't have an answer for this patient. Don't know if you want to reach out to her.  Thanks -[employer]

## 2021-07-28 ENCOUNTER — Encounter: Payer: Self-pay | Admitting: Obstetrics and Gynecology

## 2021-08-01 ENCOUNTER — Other Ambulatory Visit (INDEPENDENT_AMBULATORY_CARE_PROVIDER_SITE_OTHER): Payer: Self-pay | Admitting: Bariatrics

## 2021-08-01 ENCOUNTER — Other Ambulatory Visit: Payer: Self-pay

## 2021-08-01 ENCOUNTER — Encounter (INDEPENDENT_AMBULATORY_CARE_PROVIDER_SITE_OTHER): Payer: Self-pay | Admitting: Bariatrics

## 2021-08-01 ENCOUNTER — Ambulatory Visit (INDEPENDENT_AMBULATORY_CARE_PROVIDER_SITE_OTHER): Payer: BC Managed Care – PPO | Admitting: Bariatrics

## 2021-08-01 VITALS — BP 122/80 | HR 78 | Temp 97.7°F | Ht 65.0 in | Wt 248.0 lb

## 2021-08-01 DIAGNOSIS — Z6841 Body Mass Index (BMI) 40.0 and over, adult: Secondary | ICD-10-CM | POA: Diagnosis not present

## 2021-08-01 DIAGNOSIS — R632 Polyphagia: Secondary | ICD-10-CM | POA: Diagnosis not present

## 2021-08-01 DIAGNOSIS — E559 Vitamin D deficiency, unspecified: Secondary | ICD-10-CM | POA: Diagnosis not present

## 2021-08-01 DIAGNOSIS — E669 Obesity, unspecified: Secondary | ICD-10-CM

## 2021-08-01 MED ORDER — PHENTERMINE-TOPIRAMATE ER 7.5-46 MG PO CP24: ORAL_CAPSULE | ORAL | 0 refills | Status: DC

## 2021-08-01 MED ORDER — VITAMIN D (ERGOCALCIFEROL) 1.25 MG (50000 UNIT) PO CAPS: 50000.0000 [IU] | ORAL_CAPSULE | ORAL | 0 refills | Status: DC

## 2021-08-01 NOTE — Progress Notes (Signed)
Chief Complaint:   Sara Stevenson is here to discuss her progress with her obesity treatment plan along with follow-up of her obesity related diagnoses. Sara Stevenson is on the Category 2 Plan or keeping a food journal and adhering to recommended goals of 1200 calories and 80 grams of protein daily and states she is following her eating plan approximately 40% of the time. Sara Stevenson states she is doing home workouts for 30 minutes 2 times per week.  Today's visit was #: 20 Starting weight: 289 lbs Starting date: 06/13/2020 Today's weight: 248 lbs Today's date: 08/01/2021 Total lbs lost to date: 41 Total lbs lost since last in-office visit: 0  Interim History: Sara Stevenson is up 2 lbs since her last visit.  Subjective:   1. Vitamin D deficiency Sara Stevenson is taking Vitamin D once weekly.  [place] is taking Phentermine-Topiramate daily  Assessment/Plan:   1. Vitamin D deficiency Low Vitamin D level contributes to fatigue and are associated with obesity, breast, and colon cancer. We will refill prescription Vitamin D for 1 month. Nahjae will follow-up for routine testing of Vitamin D, at least 2-3 times per year to avoid over-replacement.  - Vitamin D, Ergocalciferol, (DRISDOL) 1.25 MG (50000 UNIT) CAPS capsule; Take 1 capsule (50,000 Units total) by mouth every 7 (seven) days.  Dispense: 4 capsule; Refill: 0  2. Polyphagia Intensive lifestyle modifications are the first line treatment for this issue. We discussed several lifestyle modifications today. Sara Stevenson will continue Phentermine-Topiramate, and keep her protein high. Orders and follow up as documented in patient record.  Counseling Polyphagia is excessive hunger. Causes can include: low blood sugars, hypERthyroidism, PMS, lack of sleep, stress, insulin resistance, diabetes, certain medications, and diets that are deficient in protein and fiber.   3. Obesity with current BMI of 41.3 Sara Stevenson is currently in the action stage of  change. As such, her goal is to continue with weight loss efforts. She has agreed to the Category 2 Plan.   Meal planning. Intentional eating. Increase protein to 80 grams daily. We will recheck fasting labs at her next visit.  We discussed various medication options to help Sara Stevenson with her weight loss efforts and we both agreed to continue Phentermine-Topiramate, and we will refill for 1 month.  - Phentermine-Topiramate 7.5-46 MG CP24; 1 capsule daily  Dispense: 30 capsule; Refill: 0  Exercise goals: As is.  Behavioral modification strategies: increasing lean protein intake, decreasing simple carbohydrates, increasing vegetables, increasing water intake, decreasing eating out, no skipping meals, meal planning and cooking strategies, keeping healthy foods in the home, and planning for success.  Sara Stevenson has agreed to follow-up with our clinic in 3 to 4 weeks. She was informed of the importance of frequent follow-up visits to maximize her success with intensive lifestyle modifications for her multiple health conditions.   Objective:   Blood pressure 122/80, pulse 78, temperature 97.7 F (36.5 C), height 5\' 5"  (1.651 m), weight 248 lb (112.5 kg), SpO2 100 %. Body mass index is 41.27 kg/m.  General: Cooperative, alert, well developed, in no acute distress. HEENT: Conjunctivae and lids unremarkable. Cardiovascular: Regular rhythm.  Lungs: Normal work of breathing. Neurologic: No focal deficits.   Lab Results  Component Value Date   CREATININE 0.95 03/06/2021   BUN 9 03/06/2021   NA 140 03/06/2021   K 4.0 03/06/2021   CL 108 (H) 03/06/2021   CO2 18 (L) 03/06/2021   Lab Results  Component Value Date   ALT 7 03/06/2021   AST  13 03/06/2021   ALKPHOS 127 (H) 03/06/2021   BILITOT <0.2 03/06/2021   Lab Results  Component Value Date   HGBA1C 6.2 (H) 03/06/2021   HGBA1C 6.0 (H) 10/10/2020   HGBA1C 6.0 (H) 05/02/2020   HGBA1C 6.1 (H) 03/27/2019   HGBA1C 5.9 (H) 03/14/2018   Lab  Results  Component Value Date   INSULIN 13.8 03/06/2021   INSULIN 26.7 (H) 10/10/2020   INSULIN 32.5 (H) 06/13/2020   Lab Results  Component Value Date   TSH 2.030 05/02/2020   Lab Results  Component Value Date   CHOL 133 05/02/2020   HDL 43 05/02/2020   LDLCALC 74 05/02/2020   TRIG 79 05/02/2020   CHOLHDL 3.1 05/02/2020   Lab Results  Component Value Date   VD25OH 32.7 03/06/2021   VD25OH 28.6 (L) 10/10/2020   VD25OH 5.9 (L) 06/13/2020   Lab Results  Component Value Date   WBC 9.0 05/02/2020   HGB 11.5 05/02/2020   HCT 36.4 05/02/2020   MCV 81 05/02/2020   No results found for: IRON, TIBC, FERRITIN  Attestation Statements:   Reviewed by clinician on day of visit: allergies, medications, problem list, medical history, surgical history, family history, social history, and previous encounter notes.   Wilhemena Durie, am acting as Location manager for [employer], DO.  I have reviewed the above documentation for accuracy and completeness, and I agree with the above. Jearld Lesch, DO

## 2021-08-28 ENCOUNTER — Ambulatory Visit (INDEPENDENT_AMBULATORY_CARE_PROVIDER_SITE_OTHER): Payer: BC Managed Care – PPO | Admitting: Bariatrics

## 2021-08-28 ENCOUNTER — Other Ambulatory Visit (INDEPENDENT_AMBULATORY_CARE_PROVIDER_SITE_OTHER): Payer: Self-pay | Admitting: Bariatrics

## 2021-08-28 ENCOUNTER — Encounter (INDEPENDENT_AMBULATORY_CARE_PROVIDER_SITE_OTHER): Payer: Self-pay | Admitting: Bariatrics

## 2021-08-28 ENCOUNTER — Other Ambulatory Visit: Payer: Self-pay

## 2021-08-28 VITALS — BP 120/82 | HR 80 | Temp 98.4°F | Ht 65.0 in | Wt 248.0 lb

## 2021-08-28 DIAGNOSIS — E559 Vitamin D deficiency, unspecified: Secondary | ICD-10-CM

## 2021-08-28 DIAGNOSIS — R7303 Prediabetes: Secondary | ICD-10-CM

## 2021-08-28 DIAGNOSIS — E669 Obesity, unspecified: Secondary | ICD-10-CM

## 2021-08-28 DIAGNOSIS — Z6841 Body Mass Index (BMI) 40.0 and over, adult: Secondary | ICD-10-CM

## 2021-08-28 MED ORDER — VITAMIN D (ERGOCALCIFEROL) 1.25 MG (50000 UNIT) PO CAPS: 50000.0000 [IU] | ORAL_CAPSULE | ORAL | 0 refills | Status: DC

## 2021-08-28 MED ORDER — PHENTERMINE-TOPIRAMATE ER 7.5-46 MG PO CP24: ORAL_CAPSULE | ORAL | 0 refills | Status: DC

## 2021-08-29 LAB — COMPREHENSIVE METABOLIC PANEL
ALT: 10 IU/L (ref 0–32)
AST: 10 IU/L (ref 0–40)
Albumin/Globulin Ratio: 1.2 (ref 1.2–2.2)
Albumin: 3.6 g/dL — ABNORMAL LOW (ref 3.8–4.8)
Alkaline Phosphatase: 106 IU/L (ref 44–121)
BUN/Creatinine Ratio: 9 (ref 9–23)
BUN: 10 mg/dL (ref 6–20)
Bilirubin Total: <0.2 mg/dL (ref 0.0–1.2)
CO2: 23 mmol/L (ref 20–29)
Calcium: 9.3 mg/dL (ref 8.7–10.2)
Chloride: 108 mmol/L — ABNORMAL HIGH (ref 96–106)
Creatinine, Ser: 1.08 mg/dL — ABNORMAL HIGH (ref 0.57–1.00)
Globulin, Total: 3.1 g/dL (ref 1.5–4.5)
Glucose: 95 mg/dL (ref 70–99)
Potassium: 4.5 mmol/L (ref 3.5–5.2)
Sodium: 140 mmol/L (ref 134–144)
Total Protein: 6.7 g/dL (ref 6.0–8.5)
eGFR: 70 mL/min/{1.73_m2} (ref >59)

## 2021-08-29 LAB — VITAMIN D 25 HYDROXY (VIT D DEFICIENCY, FRACTURES): Vit D, 25-Hydroxy: 31.2 ng/mL (ref 30.0–100.0)

## 2021-08-29 LAB — HEMOGLOBIN A1C
Est. average glucose Bld gHb Est-mCnc: 120 mg/dL
Hgb A1c MFr Bld: 5.8 % — ABNORMAL HIGH (ref 4.8–5.6)

## 2021-08-29 LAB — INSULIN, RANDOM: INSULIN: 21 u[IU]/mL (ref 2.6–24.9)

## 2021-08-30 ENCOUNTER — Encounter: Payer: Self-pay | Admitting: Obstetrics and Gynecology

## 2021-08-30 ENCOUNTER — Encounter (INDEPENDENT_AMBULATORY_CARE_PROVIDER_SITE_OTHER): Payer: Self-pay | Admitting: Bariatrics

## 2021-08-30 NOTE — Progress Notes (Signed)
? ? ? ?Chief Complaint:  ? ?OBESITY ?Sara Stevenson is here to discuss her progress with her obesity treatment plan along with follow-up of her obesity related diagnoses. [name] is on the Category 2 Plan and states she is following her eating plan approximately 40% of the time. [name] states she is walking for 30-35 minutes 2 times per week. ? ?Today's visit was #: 21 ?Starting weight: 289 lbs ?Starting date: 06/13/2020 ?Today's weight: 248 lbs ?Today's date: 08/28/2021 ?Total lbs lost to date: 41 lbs ?Total lbs lost since last in-office visit: 0 ? ?Interim History: [name]'s weight remains the same as her previous visit, but she has don well overall. She is taking Qsymia and has lost 36 lbs since starting the medication. ? ?Subjective:  ? ?1. Vitamin D deficiency ?[name] is taking her medication as directed.  ? ?2. Pre-diabetes ?[name] is not on medications currently.  ? ?Assessment/Plan:  ? ?1. Vitamin D deficiency ?Low Vitamin D level contributes to fatigue and are associated with obesity, breast, and colon cancer. We will refill prescription Vitamin D 50,000 IU every week for 1 month with no refills and [name] will follow-up for routine testing of Vitamin D, at least 2-3 times per year to avoid over-replacement.We will check Vitamin D today.  ? ?- Vitamin D, Ergocalciferol, (DRISDOL) 1.25 MG (50000 UNIT) CAPS capsule; Take 1 capsule (50,000 Units total) by mouth every 7 (seven) days.  Dispense: 4 capsule; Refill: 0 ?- VITAMIN D 25 Hydroxy (Vit-D Deficiency, Fractures) ? ?2. Pre-diabetes ?We will check A1C and Insulin today. [name] agrees to start Qysmia 7.5 mg.[name] will continue to work on weight loss, exercise, and decreasing simple carbohydrates to help decrease the risk of diabetes.  ? ?- Insulin, random ?- Hemoglobin A1c ?- Comprehensive metabolic panel ? ?3. Obesity with current BMI of 41.3 ?[name] is currently in the action stage of change. As such, her goal is to continue with weight loss efforts. She has agreed  to the Category 2 Plan and keeping a food journal and adhering to recommended goals of 1200 calories and 80 grams of protein.  ? ?We will refill Qsymia 7.5 mg for 1 month with no refills.  ? ?- Phentermine-Topiramate 7.5-46 MG CP24; 1 capsule daily  Dispense: 30 capsule; Refill: 0 ? ?Exercise goals:  As is. ? ?Behavioral modification strategies: increasing lean protein intake, decreasing simple carbohydrates, increasing vegetables, increasing water intake, decreasing eating out, no skipping meals, meal planning and cooking strategies, keeping healthy foods in the home, and planning for success. ? ?[name] has agreed to follow-up with our clinic in 4 weeks with [name], FNP. She was informed of the importance of frequent follow-up visits to maximize her success with intensive lifestyle modifications for her multiple health conditions.  ? ?[name] was informed we would discuss her lab results at her next visit unless there is a critical issue that needs to be addressed sooner. [name] agreed to keep her next visit at the agreed upon time to discuss these results. ? ?Objective:  ? ?Blood pressure 120/82, pulse 80, temperature 98.4 ?F (36.9 ?C), height 5\' 5"  (1.651 m), weight 248 lb (112.5 kg), SpO2 99 %. ?Body mass index is 41.27 kg/m?. ? ?General: Cooperative, alert, well developed, in no acute distress. ?HEENT: Conjunctivae and lids unremarkable. ?Cardiovascular: Regular rhythm.  ?Lungs: Normal work of breathing. ?Neurologic: No focal deficits.  ? ?Lab Results  ?Component Value Date  ? CREATININE 1.08 (H) 08/28/2021  ? BUN 10 08/28/2021  ? NA 140 08/28/2021  ?  K 4.5 08/28/2021  ? CL 108 (H) 08/28/2021  ? CO2 23 08/28/2021  ? ?Lab Results  ?Component Value Date  ? ALT 10 08/28/2021  ? AST 10 08/28/2021  ? ALKPHOS 106 08/28/2021  ? BILITOT <0.2 08/28/2021  ? ?Lab Results  ?Component Value Date  ? HGBA1C 5.8 (H) 08/28/2021  ? HGBA1C 6.2 (H) 03/06/2021  ? HGBA1C 6.0 (H) 10/10/2020  ? HGBA1C 6.0 (H) 05/02/2020  ?  HGBA1C 6.1 (H) 03/27/2019  ? ?Lab Results  ?Component Value Date  ? INSULIN 21.0 08/28/2021  ? INSULIN 13.8 03/06/2021  ? INSULIN 26.7 (H) 10/10/2020  ? INSULIN 32.5 (H) 06/13/2020  ? ?Lab Results  ?Component Value Date  ? TSH 2.030 05/02/2020  ? ?Lab Results  ?Component Value Date  ? CHOL 133 05/02/2020  ? HDL 43 05/02/2020  ? LDLCALC 74 05/02/2020  ? TRIG 79 05/02/2020  ? CHOLHDL 3.1 05/02/2020  ? ?Lab Results  ?Component Value Date  ? VD25OH 31.2 08/28/2021  ? VD25OH 32.7 03/06/2021  ? VD25OH 28.6 (L) 10/10/2020  ? ?Lab Results  ?Component Value Date  ? WBC 9.0 05/02/2020  ? HGB 11.5 05/02/2020  ? HCT 36.4 05/02/2020  ? MCV 81 05/02/2020  ? ?No results found for: IRON, TIBC, FERRITIN ? ?Attestation Statements:  ? ?Reviewed by clinician on day of visit: allergies, medications, problem list, medical history, surgical history, family history, social history, and previous encounter notes. ? ?I, Jackson Latino, RMA, am acting as transcriptionist for [employer], DO. ? ?I have reviewed the above documentation for accuracy and completeness, and I agree with the above. Corinna Capra, DO ? ?

## 2021-09-03 ENCOUNTER — Other Ambulatory Visit (INDEPENDENT_AMBULATORY_CARE_PROVIDER_SITE_OTHER): Payer: Self-pay | Admitting: Bariatrics

## 2021-09-03 DIAGNOSIS — E559 Vitamin D deficiency, unspecified: Secondary | ICD-10-CM

## 2021-09-18 NOTE — Progress Notes (Signed)
?TeleHealth Visit:  ?Due to the COVID-19 pandemic, this visit was completed with telemedicine (audio/video) technology to reduce patient and provider exposure as well as to preserve personal protective equipment.  ? ?Sara Stevenson has verbally consented to this TeleHealth visit. The patient is located at home, the provider is located at home. The participants in this visit include the listed provider and patient. The visit was conducted today via MyChart video. ? ?OBESITY ?Sara Stevenson is here to discuss her progress with her obesity treatment plan along with follow-up of her obesity related diagnoses.  ? ?Today's visit was # 22 ?Starting weight: 289 lbs ?Starting date: 06/13/20 ?Total weight loss: 41 lbs at last in office visit on 08/28/21. ?Weight at last in office visit: 248 lbs on 08/28/21 ?Today's reported weight: 247 lbs  ?  ? ?Nutrition Plan: the Category 2 Plan and keeping a food journal and adhering to recommended goals of 1200 calories and 80 gms protein about 50% of time. ?Hunger is well controlled. Cravings are well controlled.  ?Current exercise: walking 2 days per week for 30-35 minutes. ? ?Interim History: Sara Stevenson has been journaling a little bit but mostly following category 2.  She is having a protein shake for breakfast.  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  She also contends with some coworker sabotage since they tend to go out to eat at lunch. ?Appetite is well controlled with Qsymia 7.5/46 mg dose.  She denies any side effects and reports she is sleeping well.  Pulses and blood pressure readings have been normal. ? ?Assessment/Plan:  ?Vitamin D Deficiency ?Vitamin D is not at goal of 50. She is on weekly prescription Vitamin D 50,000 IU.  ?Lab Results  ?Component Value Date  ? VD25OH 31.2 08/28/2021  ? VD25OH 32.7 03/06/2021  ? VD25OH 28.6 (L)  10/10/2020  ? ? ?Plan: ?Continue prescription vitamin D 50,000 IU weekly. ? ?Prediabetes ?Sara Stevenson has a diagnosis of prediabetes based on her elevated HgA1c. ?She denies polyphagia. ?Medication(s): none ?Lab Results  ?Component Value Date  ? HGBA1C 5.8 (H) 08/28/2021  ? ?Lab Results  ?Component Value Date  ? INSULIN 21.0 08/28/2021  ? INSULIN 13.8 03/06/2021  ? INSULIN 26.7 (H) 10/10/2020  ? INSULIN 32.5 (H) 06/13/2020  ? ? ?Plan: ?Continue meal plan. ? ?Obesity: Current BMI 41.27 ?Sara Stevenson is currently in the action stage of change. As such, her goal is to continue with weight loss efforts.  ?She has agreed to the Category 2 Plan and keeping a food journal and adhering to recommended goals of 1200 calories and 80 gms protein .  ? ?Exercise goals:  continue current regimen. ? ?Behavioral modification strategies: decreasing eating out and meal planning and cooking strategies. ? ?Continue Qsymia at current dose 7.5/46 mg daily.  She is currently paying $180 out-of-pocket for Qsymia.  I advised there is a mail order pharmacy that she could do to get it for $99 a month.  I will have Dr. Manson Passey send her prescription into the [facility] pharmacy. ?Handouts: Dining out via [employer]. ? ?Sara Stevenson has agreed to follow-up with our clinic in 6 weeks.  ? ?No orders of the defined types were placed in this encounter. ? ? ?There are no discontinued medications.  ? ?No orders of the defined types were placed in this encounter. ?   ? ?Objective:  ? ?  VITALS: Per patient if applicable, see vitals. ?GENERAL: Alert and in no acute distress. ?CARDIOPULMONARY: No increased WOB. Speaking in clear sentences.  ?PSYCH: Pleasant and cooperative. Speech normal rate and rhythm. Affect is appropriate. Insight and judgement are appropriate. Attention is focused, linear, and appropriate.  ?NEURO: Oriented as arrived to appointment on time with no prompting.  ? ?Lab Results  ?Component Value Date  ? CREATININE 1.08 (H) 08/28/2021  ? BUN 10 08/28/2021  ? NA  140 08/28/2021  ? K 4.5 08/28/2021  ? CL 108 (H) 08/28/2021  ? CO2 23 08/28/2021  ? ?Lab Results  ?Component Value Date  ? ALT 10 08/28/2021  ? AST 10 08/28/2021  ? ALKPHOS 106 08/28/2021  ? BILITOT <0.2 08/28/2021  ? ?Lab Results  ?Component Value Date  ? HGBA1C 5.8 (H) 08/28/2021  ? HGBA1C 6.2 (H) 03/06/2021  ? HGBA1C 6.0 (H) 10/10/2020  ? HGBA1C 6.0 (H) 05/02/2020  ? HGBA1C 6.1 (H) 03/27/2019  ? ?Lab Results  ?Component Value Date  ? INSULIN 21.0 08/28/2021  ? INSULIN 13.8 03/06/2021  ? INSULIN 26.7 (H) 10/10/2020  ? INSULIN 32.5 (H) 06/13/2020  ? ?Lab Results  ?Component Value Date  ? TSH 2.030 05/02/2020  ? ?Lab Results  ?Component Value Date  ? CHOL 133 05/02/2020  ? HDL 43 05/02/2020  ? LDLCALC 74 05/02/2020  ? TRIG 79 05/02/2020  ? CHOLHDL 3.1 05/02/2020  ? ?Lab Results  ?Component Value Date  ? WBC 9.0 05/02/2020  ? HGB 11.5 05/02/2020  ? HCT 36.4 05/02/2020  ? MCV 81 05/02/2020  ? ?No results found for: IRON, TIBC, FERRITIN ?Lab Results  ?Component Value Date  ? VD25OH 31.2 08/28/2021  ? VD25OH 32.7 03/06/2021  ? VD25OH 28.6 (L) 10/10/2020  ? ? ?Attestation Statements:  ? ?Reviewed by clinician on day of visit: allergies, medications, problem list, medical history, surgical history, family history, social history, and previous encounter notes. ? ?Time spent on visit including pre-visit chart review and post-visit charting and care was 35 minutes.  ? ? ?

## 2021-09-19 ENCOUNTER — Telehealth (INDEPENDENT_AMBULATORY_CARE_PROVIDER_SITE_OTHER): Payer: BC Managed Care – PPO | Admitting: Family Medicine

## 2021-09-19 ENCOUNTER — Encounter (INDEPENDENT_AMBULATORY_CARE_PROVIDER_SITE_OTHER): Payer: Self-pay | Admitting: Family Medicine

## 2021-09-19 ENCOUNTER — Other Ambulatory Visit (INDEPENDENT_AMBULATORY_CARE_PROVIDER_SITE_OTHER): Payer: Self-pay | Admitting: Bariatrics

## 2021-09-19 DIAGNOSIS — E559 Vitamin D deficiency, unspecified: Secondary | ICD-10-CM | POA: Diagnosis not present

## 2021-09-19 DIAGNOSIS — E669 Obesity, unspecified: Secondary | ICD-10-CM | POA: Diagnosis not present

## 2021-09-19 DIAGNOSIS — R7303 Prediabetes: Secondary | ICD-10-CM

## 2021-09-19 DIAGNOSIS — Z6841 Body Mass Index (BMI) 40.0 and over, adult: Secondary | ICD-10-CM

## 2021-09-19 MED ORDER — PHENTERMINE-TOPIRAMATE ER 7.5-46 MG PO CP24: ORAL_CAPSULE | ORAL | 0 refills | Status: DC

## 2021-10-31 ENCOUNTER — Encounter (INDEPENDENT_AMBULATORY_CARE_PROVIDER_SITE_OTHER): Payer: Self-pay | Admitting: Family Medicine

## 2021-10-31 ENCOUNTER — Ambulatory Visit (INDEPENDENT_AMBULATORY_CARE_PROVIDER_SITE_OTHER): Payer: BC Managed Care – PPO | Admitting: Bariatrics

## 2021-10-31 ENCOUNTER — Telehealth (INDEPENDENT_AMBULATORY_CARE_PROVIDER_SITE_OTHER): Payer: BC Managed Care – PPO | Admitting: Family Medicine

## 2021-10-31 DIAGNOSIS — E669 Obesity, unspecified: Secondary | ICD-10-CM | POA: Diagnosis not present

## 2021-10-31 DIAGNOSIS — Z6841 Body Mass Index (BMI) 40.0 and over, adult: Secondary | ICD-10-CM | POA: Diagnosis not present

## 2021-10-31 DIAGNOSIS — E559 Vitamin D deficiency, unspecified: Secondary | ICD-10-CM

## 2021-10-31 DIAGNOSIS — R632 Polyphagia: Secondary | ICD-10-CM | POA: Diagnosis not present

## 2021-10-31 MED ORDER — VITAMIN D (ERGOCALCIFEROL) 1.25 MG (50000 UNIT) PO CAPS: 50000.0000 [IU] | ORAL_CAPSULE | ORAL | 0 refills | Status: DC

## 2021-10-31 NOTE — Progress Notes (Signed)
TeleHealth Visit:  This visit was completed with telemedicine (audio/video) technology. Sara Stevenson has verbally consented to this TeleHealth visit. The patient is located at home, the provider is located at home. The participants in this visit include the listed provider and patient. The visit was conducted today via phone call. Patient was unable to connect to video. Length of call was 21 minutes.   OBESITY Sara Stevenson is here to discuss her progress with her obesity treatment plan along with follow-up of her obesity related diagnoses.   Today's visit was # 23 Starting date: 06/13/20 Total weight loss: 41 lbs at last in office visit on 08/28/21. Weight at last in office visit: 248 lbs on 08/28/21 Today's reported weight: 252 lbs .   Nutrition Plan: the Category 2 Plan and keeping a food journal and adhering to recommended goals of 1200 calories and 80 gms protein.  Hunger is moderately controlled. Cravings are moderately controlled.  Current exercise:  walking  25-35 minutes several days per week.  Interim History: She is struggling with some hunger in the evenings.  She is on the second dose of Qsymia 7.5/46 mg daily.  She does well on plan breakfast and lunch but dinner is a challenge because she works 2 jobs and either skips dinner or eats out between these jobs.  She does not work on the weekends.  She also tends to be a picky eater by her own admission.  She eats mostly chicken but will occasionally have fish or pork.  She is not journaling consistently.  Assessment/Plan:  1. Vitamin D Deficiency Vitamin D is not at goal of 50. She is on weekly prescription Vitamin D 50,000 IU.  Lab Results  Component Value Date   VD25OH 31.2 08/28/2021   VD25OH 32.7 03/06/2021   VD25OH 28.6 (L) 10/10/2020    Plan: Refill prescription vitamin D 50,000 IU weekly.   2. Polyphagia Sara Stevenson endorses excessive hunger-specially in the evenings. Medication(s): Qsymia 7.5/46 mg daily. Effects of  medication:  moderately controlled. Cravings are moderately controlled.   Plan: I will have Dr. Manson Passey refill Qsymia at the next dose up -11.25/69 mg daily. If hunger not improved with increased dose, consider starting GLP-1.   3. Obesity: Current BMI 41.3 Sara Stevenson is currently in the action stage of change. As such, her goal is to continue with weight loss efforts.  She has agreed to he Category 2 Plan and keeping a food journal and adhering to recommended goals of 1200 calories and 80 gms protein. Marland Kitchen   Exercise goals: Continue current regimen.  Behavioral modification strategies: increasing lean protein intake, decreasing eating out, and meal planning and cooking strategies. Encouraged her to meal prep both lunch and dinner and take it with her to work to reduce eating out.  Sara Stevenson has agreed to follow-up with our clinic in 4 weeks.   No orders of the defined types were placed in this encounter.   Medications Discontinued During This Encounter  Medication Reason   Vitamin D, Ergocalciferol, (DRISDOL) 1.25 MG (50000 UNIT) CAPS capsule Reorder     Meds ordered this encounter  Medications   Vitamin D, Ergocalciferol, (DRISDOL) 1.25 MG (50000 UNIT) CAPS capsule    Sig: Take 1 capsule (50,000 Units total) by mouth every 7 (seven) days.    Dispense:  4 capsule    Refill:  0    Order Specific Question:   Supervising Provider    Answer:   Quillian Quince D [[id number]]      Objective:  VITALS: Per patient if applicable, see vitals. GENERAL: Alert and in no acute distress. CARDIOPULMONARY: No increased WOB. Speaking in clear sentences.  PSYCH: Pleasant and cooperative. Speech normal rate and rhythm. Affect is appropriate. Insight and judgement are appropriate. Attention is focused, linear, and appropriate.  NEURO: Oriented as arrived to appointment on time with no prompting.   Lab Results  Component Value Date   CREATININE 1.08 (H) 08/28/2021   BUN 10 08/28/2021   NA 140 08/28/2021    K 4.5 08/28/2021   CL 108 (H) 08/28/2021   CO2 23 08/28/2021   Lab Results  Component Value Date   ALT 10 08/28/2021   AST 10 08/28/2021   ALKPHOS 106 08/28/2021   BILITOT <0.2 08/28/2021   Lab Results  Component Value Date   HGBA1C 5.8 (H) 08/28/2021   HGBA1C 6.2 (H) 03/06/2021   HGBA1C 6.0 (H) 10/10/2020   HGBA1C 6.0 (H) 05/02/2020   HGBA1C 6.1 (H) 03/27/2019   Lab Results  Component Value Date   INSULIN 21.0 08/28/2021   INSULIN 13.8 03/06/2021   INSULIN 26.7 (H) 10/10/2020   INSULIN 32.5 (H) 06/13/2020   Lab Results  Component Value Date   TSH 2.030 05/02/2020   Lab Results  Component Value Date   CHOL 133 05/02/2020   HDL 43 05/02/2020   LDLCALC 74 05/02/2020   TRIG 79 05/02/2020   CHOLHDL 3.1 05/02/2020   Lab Results  Component Value Date   WBC 9.0 05/02/2020   HGB 11.5 05/02/2020   HCT 36.4 05/02/2020   MCV 81 05/02/2020   No results found for: IRON, TIBC, FERRITIN Lab Results  Component Value Date   VD25OH 31.2 08/28/2021   VD25OH 32.7 03/06/2021   VD25OH 28.6 (L) 10/10/2020    Attestation Statements:   Reviewed by clinician on day of visit: allergies, medications, problem list, medical history, surgical history, family history, social history, and previous encounter notes.

## 2021-11-01 ENCOUNTER — Other Ambulatory Visit (INDEPENDENT_AMBULATORY_CARE_PROVIDER_SITE_OTHER): Payer: Self-pay | Admitting: Bariatrics

## 2021-11-01 MED ORDER — QSYMIA 11.25-69 MG PO CP24: ORAL_CAPSULE | ORAL | 0 refills | Status: DC

## 2021-11-06 ENCOUNTER — Encounter (INDEPENDENT_AMBULATORY_CARE_PROVIDER_SITE_OTHER): Payer: Self-pay | Admitting: Bariatrics

## 2021-11-27 NOTE — Progress Notes (Unsigned)
TeleHealth Visit:  This visit was completed with telemedicine (audio/video) technology. Sara Stevenson has verbally consented to this TeleHealth visit. The patient is located at home, the provider is located at home. The participants in this visit include the listed provider and patient. The visit was conducted today via MyChart video.  OBESITY Sara Stevenson is here to discuss her progress with her obesity treatment plan along with follow-up of her obesity related diagnoses.   Today's visit was # 24 Starting weight: 289 lbs Starting date: 06/13/2020 Weight at last in office visit: 248 lbs on 08/28/21 Total weight loss: 41 lbs at last in office visit on 08/28/21. Today's reported weight: *** lbs No weight reported.  Nutrition Plan: keeping a food journal and adhering to recommended goals of 1200 calories and 80 gms protein.  Hunger is {EWCONTROLASSESSMENT:24261}. Cravings are {EWCONTROLASSESSMENT:24261}.  Current exercise: {exercise types:16438}  Interim History: ***  Assessment/Plan:  1. ***  2. ***  3. ***  Obesity: Current BMI *** [name] {CHL AMB IS/IS NOT:210130109} currently in the action stage of change. As such, her goal is to {MWMwtloss#1:210800005}.  She has agreed to {MWMwtlossportion/plan2:23431}.   Exercise goals: {MWM EXERCISE RECS:23473}  Behavioral modification strategies: {MWMwtlossdietstrategies3:23432}.  [name] has agreed to follow-up with our clinic in {NUMBER 1-10:22536} weeks.   No orders of the defined types were placed in this encounter.   There are no discontinued medications.   No orders of the defined types were placed in this encounter.     Objective:   VITALS: Per patient if applicable, see vitals. GENERAL: Alert and in no acute distress. CARDIOPULMONARY: No increased WOB. Speaking in clear sentences.  PSYCH: Pleasant and cooperative. Speech normal rate and rhythm. Affect is appropriate. Insight and judgement are appropriate. Attention is focused,  linear, and appropriate.  NEURO: Oriented as arrived to appointment on time with no prompting.   Lab Results  Component Value Date   CREATININE 1.08 (H) 08/28/2021   BUN 10 08/28/2021   NA 140 08/28/2021   K 4.5 08/28/2021   CL 108 (H) 08/28/2021   CO2 23 08/28/2021   Lab Results  Component Value Date   ALT 10 08/28/2021   AST 10 08/28/2021   ALKPHOS 106 08/28/2021   BILITOT <0.2 08/28/2021   Lab Results  Component Value Date   HGBA1C 5.8 (H) 08/28/2021   HGBA1C 6.2 (H) 03/06/2021   HGBA1C 6.0 (H) 10/10/2020   HGBA1C 6.0 (H) 05/02/2020   HGBA1C 6.1 (H) 03/27/2019   Lab Results  Component Value Date   INSULIN 21.0 08/28/2021   INSULIN 13.8 03/06/2021   INSULIN 26.7 (H) 10/10/2020   INSULIN 32.5 (H) 06/13/2020   Lab Results  Component Value Date   TSH 2.030 05/02/2020   Lab Results  Component Value Date   CHOL 133 05/02/2020   HDL 43 05/02/2020   LDLCALC 74 05/02/2020   TRIG 79 05/02/2020   CHOLHDL 3.1 05/02/2020   Lab Results  Component Value Date   WBC 9.0 05/02/2020   HGB 11.5 05/02/2020   HCT 36.4 05/02/2020   MCV 81 05/02/2020   No results found for: "IRON", "TIBC", "FERRITIN" Lab Results  Component Value Date   VD25OH 31.2 08/28/2021   VD25OH 32.7 03/06/2021   VD25OH 28.6 (L) 10/10/2020    Attestation Statements:   Reviewed by clinician on day of visit: allergies, medications, problem list, medical history, surgical history, family history, social history, and previous encounter notes.  ***(delete if time-based billing not used) Time spent on visit including the  items listed below was *** minutes.  -preparing to see the patient (e.g., review of tests, history, previous notes) -obtaining and/or reviewing separately obtained history -counseling and educating the patient/family/caregiver -documenting clinical information in the electronic or other health record -ordering medications, tests, or procedures -independently interpreting results and  communicating results to the patient/ family/caregiver -referring and communicating with other health care professionals  -care coordination

## 2021-11-28 ENCOUNTER — Encounter (INDEPENDENT_AMBULATORY_CARE_PROVIDER_SITE_OTHER): Payer: Self-pay | Admitting: Family Medicine

## 2021-11-28 ENCOUNTER — Telehealth (INDEPENDENT_AMBULATORY_CARE_PROVIDER_SITE_OTHER): Payer: BC Managed Care – PPO | Admitting: Family Medicine

## 2021-11-28 DIAGNOSIS — E669 Obesity, unspecified: Secondary | ICD-10-CM | POA: Diagnosis not present

## 2021-11-28 DIAGNOSIS — R632 Polyphagia: Secondary | ICD-10-CM

## 2021-11-28 DIAGNOSIS — R7303 Prediabetes: Secondary | ICD-10-CM

## 2021-11-28 DIAGNOSIS — Z6841 Body Mass Index (BMI) 40.0 and over, adult: Secondary | ICD-10-CM | POA: Diagnosis not present

## 2021-11-30 NOTE — Telephone Encounter (Signed)
FYI

## 2021-12-13 ENCOUNTER — Ambulatory Visit (INDEPENDENT_AMBULATORY_CARE_PROVIDER_SITE_OTHER): Payer: BC Managed Care – PPO | Admitting: Bariatrics

## 2021-12-13 ENCOUNTER — Encounter (INDEPENDENT_AMBULATORY_CARE_PROVIDER_SITE_OTHER): Payer: Self-pay | Admitting: Bariatrics

## 2021-12-13 VITALS — BP 111/72 | HR 76 | Temp 97.9°F | Ht 65.0 in | Wt 250.0 lb

## 2021-12-13 DIAGNOSIS — E669 Obesity, unspecified: Secondary | ICD-10-CM

## 2021-12-13 DIAGNOSIS — E559 Vitamin D deficiency, unspecified: Secondary | ICD-10-CM

## 2021-12-13 DIAGNOSIS — R7303 Prediabetes: Secondary | ICD-10-CM

## 2021-12-13 DIAGNOSIS — Z6841 Body Mass Index (BMI) 40.0 and over, adult: Secondary | ICD-10-CM

## 2021-12-13 MED ORDER — VITAMIN D (ERGOCALCIFEROL) 1.25 MG (50000 UNIT) PO CAPS: 50000.0000 [IU] | ORAL_CAPSULE | ORAL | 0 refills | Status: DC

## 2021-12-13 MED ORDER — QSYMIA 11.25-69 MG PO CP24: ORAL_CAPSULE | ORAL | 0 refills | Status: DC

## 2021-12-13 NOTE — Progress Notes (Unsigned)
Chief Complaint:   OBESITY Sara Stevenson is here to discuss her progress with her obesity treatment plan along with follow-up of her obesity related diagnoses. Sara Stevenson is on following a lower carbohydrate, vegetable and lean protein rich diet plan and states she is following her eating plan approximately 0% of the time. Sara Stevenson states she is doing 0 minutes 0 times per week.  Today's visit was #: 25 Starting weight: 289 lbs Starting date: 06/13/2020 Today's weight: 250 lbs Today's date: 12/13/2021 Total lbs lost to date: 39 Total lbs lost since last in-office visit: 0  Interim History: Sara Stevenson is up 2 pounds since her last visit.  She states that her appetite is better controlled.  Subjective:   1. Prediabetes Sara Stevenson notes some polyphagia.  2. Vitamin D deficiency Sara Stevenson is taking prescription vitamin D as directed.  She notes her energy level is okay.  Assessment/Plan:   1. Prediabetes Sara Stevenson will keep all sweets and carbohydrates low.  2. Vitamin D deficiency Sara Stevenson will continue prescription vitamin D 50,000 units once weekly, and we will refill for 1 month.  - Vitamin D, Ergocalciferol, (DRISDOL) 1.25 MG (50000 UNIT) CAPS capsule; Take 1 capsule (50,000 Units total) by mouth every 7 (seven) days.  Dispense: 4 capsule; Refill: 0  3. Obesity with current BMI of 41.6 Sara Stevenson is currently in the action stage of change. As such, her goal is to continue with weight loss efforts. She has agreed to following a lower carbohydrate, vegetable and lean protein rich diet plan.   We discussed various medication options to help Sara Stevenson with her weight loss efforts and we both agreed to continue Qsymia 11.25-69 mg once daily, and we will refill for 1 month.  - Phentermine-Topiramate (QSYMIA) 11.25-69 MG CP24; 1 capsule daily  Dispense: 30 capsule; Refill: 0  Exercise goals: Exercise and at home.  Behavioral modification strategies: increasing lean protein intake, decreasing simple carbohydrates,  increasing vegetables, increasing water intake, decreasing eating out, no skipping meals, meal planning and cooking strategies, keeping healthy foods in the home, and planning for success.  Sara Stevenson has agreed to follow-up with our clinic in 4 weeks. She was informed of the importance of frequent follow-up visits to maximize her success with intensive lifestyle modifications for her multiple health conditions.   Objective:   Blood pressure 111/72, pulse 76, temperature 97.9 F (36.6 C), height 5\' 5"  (1.651 m), weight 250 lb (113.4 kg), SpO2 97 %. Body mass index is 41.6 kg/m.  General: Cooperative, alert, well developed, in no acute distress. HEENT: Conjunctivae and lids unremarkable. Cardiovascular: Regular rhythm.  Lungs: Normal work of breathing. Neurologic: No focal deficits.   Lab Results  Component Value Date   CREATININE 1.08 (H) 08/28/2021   BUN 10 08/28/2021   NA 140 08/28/2021   K 4.5 08/28/2021   CL 108 (H) 08/28/2021   CO2 23 08/28/2021   Lab Results  Component Value Date   ALT 10 08/28/2021   AST 10 08/28/2021   ALKPHOS 106 08/28/2021   BILITOT <0.2 08/28/2021   Lab Results  Component Value Date   HGBA1C 5.8 (H) 08/28/2021   HGBA1C 6.2 (H) 03/06/2021   HGBA1C 6.0 (H) 10/10/2020   HGBA1C 6.0 (H) 05/02/2020   HGBA1C 6.1 (H) 03/27/2019   Lab Results  Component Value Date   INSULIN 21.0 08/28/2021   INSULIN 13.8 03/06/2021   INSULIN 26.7 (H) 10/10/2020   INSULIN 32.5 (H) 06/13/2020   Lab Results  Component Value Date   TSH 2.030  05/02/2020   Lab Results  Component Value Date   CHOL 133 05/02/2020   HDL 43 05/02/2020   LDLCALC 74 05/02/2020   TRIG 79 05/02/2020   CHOLHDL 3.1 05/02/2020   Lab Results  Component Value Date   VD25OH 31.2 08/28/2021   VD25OH 32.7 03/06/2021   VD25OH 28.6 (L) 10/10/2020   Lab Results  Component Value Date   WBC 9.0 05/02/2020   HGB 11.5 05/02/2020   HCT 36.4 05/02/2020   MCV 81 05/02/2020   No results found  for: "IRON", "TIBC", "FERRITIN"  Attestation Statements:   Reviewed by clinician on day of visit: allergies, medications, problem list, medical history, surgical history, family history, social history, and previous encounter notes.   Trude Mcburney, am acting as Energy manager for [employer], DO.  I have reviewed the above documentation for accuracy and completeness, and I agree with the above. Corinna Capra, DO

## 2021-12-14 ENCOUNTER — Encounter (INDEPENDENT_AMBULATORY_CARE_PROVIDER_SITE_OTHER): Payer: Self-pay | Admitting: Bariatrics

## 2022-01-02 ENCOUNTER — Telehealth (INDEPENDENT_AMBULATORY_CARE_PROVIDER_SITE_OTHER): Payer: BC Managed Care – PPO | Admitting: Family Medicine

## 2022-01-17 ENCOUNTER — Encounter (INDEPENDENT_AMBULATORY_CARE_PROVIDER_SITE_OTHER): Payer: Self-pay

## 2022-01-25 ENCOUNTER — Ambulatory Visit (INDEPENDENT_AMBULATORY_CARE_PROVIDER_SITE_OTHER): Payer: BC Managed Care – PPO | Admitting: Bariatrics

## 2022-01-25 ENCOUNTER — Encounter (INDEPENDENT_AMBULATORY_CARE_PROVIDER_SITE_OTHER): Payer: Self-pay | Admitting: Bariatrics

## 2022-01-25 VITALS — BP 105/70 | HR 83 | Temp 97.8°F | Ht 65.0 in | Wt 256.0 lb

## 2022-01-25 DIAGNOSIS — R632 Polyphagia: Secondary | ICD-10-CM | POA: Diagnosis not present

## 2022-01-25 DIAGNOSIS — E669 Obesity, unspecified: Secondary | ICD-10-CM

## 2022-01-25 DIAGNOSIS — Z6841 Body Mass Index (BMI) 40.0 and over, adult: Secondary | ICD-10-CM

## 2022-01-25 DIAGNOSIS — E559 Vitamin D deficiency, unspecified: Secondary | ICD-10-CM | POA: Diagnosis not present

## 2022-01-25 MED ORDER — VITAMIN D (ERGOCALCIFEROL) 1.25 MG (50000 UNIT) PO CAPS: 50000.0000 [IU] | ORAL_CAPSULE | ORAL | 0 refills | Status: DC

## 2022-01-25 MED ORDER — QSYMIA 11.25-69 MG PO CP24: ORAL_CAPSULE | ORAL | 0 refills | Status: DC

## 2022-02-05 ENCOUNTER — Encounter (INDEPENDENT_AMBULATORY_CARE_PROVIDER_SITE_OTHER): Payer: Self-pay | Admitting: Bariatrics

## 2022-02-05 NOTE — Progress Notes (Signed)
Chief Complaint:   OBESITY Sara Stevenson is here to discuss her progress with her obesity treatment plan along with follow-up of her obesity related diagnoses. Sara Stevenson is on following a lower carbohydrate, vegetable and lean protein rich diet plan and states she is following her eating plan approximately 0% of the time. Sara Stevenson states she is doing 0 minutes 0 times per week.  Today's visit was #: 26 Starting weight: 289 lbs Starting date: 06/13/2020 Today's weight: 256 lbs Today's date: 01/25/2022 Total lbs lost to date: 33 Total lbs lost since last in-office visit: 0  Interim History: Sara Stevenson is up 6 lbs since her last visit. She was down 39 lbs at her last visit and she is now down 33 lbs. Qsymia helps with her appetite.   Subjective:   1. Vitamin D deficiency Sara Stevenson is taking prescription Vitamin D.   2. Polyphagia Sara Stevenson is taking Qsymia currently.  Assessment/Plan:   1. Vitamin D deficiency Sara Stevenson will continue prescription Vitamin D 50,000 once weekly, and we will refill for 1 month.  - Vitamin D, Ergocalciferol, (DRISDOL) 1.25 MG (50000 UNIT) CAPS capsule; Take 1 capsule (50,000 Units total) by mouth every 7 (seven) days.  Dispense: 4 capsule; Refill: 0  2. Polyphagia [name] will keep her carbohydrates low, and her protein and water intake high.   3. Obesity with current BMI of 42.7 Sara Stevenson is currently in the action stage of change. As such, her goal is to continue with weight loss efforts. She has agreed to practicing portion control and making smarter food choices, such as increasing vegetables and decreasing simple carbohydrates.   We discussed various medication options to help Sara Stevenson with her weight loss efforts and we both agreed to continue Qsymia, and we will refill for 1 month.  - Phentermine-Topiramate (QSYMIA) 11.25-69 MG CP24; 1 capsule daily  Dispense: 30 capsule; Refill: 0  Exercise goals: She will do more exercise, playing volleyball and getting a gym  membership.  Behavioral modification strategies: increasing lean protein intake, decreasing simple carbohydrates, increasing vegetables, increasing water intake, decreasing eating out, no skipping meals, meal planning and cooking strategies, keeping healthy foods in the home, and planning for success.  Sara Stevenson has agreed to follow-up with our clinic in 4 weeks. She was informed of the importance of frequent follow-up visits to maximize her success with intensive lifestyle modifications for her multiple health conditions.   Objective:   Blood pressure 105/70, pulse 83, temperature 97.8 F (36.6 C), height 5\' 5"  (1.651 m), weight 256 lb (116.1 kg), SpO2 98 %. Body mass index is 42.6 kg/m.  General: Cooperative, alert, well developed, in no acute distress. HEENT: Conjunctivae and lids unremarkable. Cardiovascular: Regular rhythm.  Lungs: Normal work of breathing. Neurologic: No focal deficits.   Lab Results  Component Value Date   CREATININE 1.08 (H) 08/28/2021   BUN 10 08/28/2021   NA 140 08/28/2021   K 4.5 08/28/2021   CL 108 (H) 08/28/2021   CO2 23 08/28/2021   Lab Results  Component Value Date   ALT 10 08/28/2021   AST 10 08/28/2021   ALKPHOS 106 08/28/2021   BILITOT <0.2 08/28/2021   Lab Results  Component Value Date   HGBA1C 5.8 (H) 08/28/2021   HGBA1C 6.2 (H) 03/06/2021   HGBA1C 6.0 (H) 10/10/2020   HGBA1C 6.0 (H) 05/02/2020   HGBA1C 6.1 (H) 03/27/2019   Lab Results  Component Value Date   INSULIN 21.0 08/28/2021   INSULIN 13.8 03/06/2021   INSULIN 26.7 (H)  10/10/2020   INSULIN 32.5 (H) 06/13/2020   Lab Results  Component Value Date   TSH 2.030 05/02/2020   Lab Results  Component Value Date   CHOL 133 05/02/2020   HDL 43 05/02/2020   LDLCALC 74 05/02/2020   TRIG 79 05/02/2020   CHOLHDL 3.1 05/02/2020   Lab Results  Component Value Date   VD25OH 31.2 08/28/2021   VD25OH 32.7 03/06/2021   VD25OH 28.6 (L) 10/10/2020   Lab Results  Component Value  Date   WBC 9.0 05/02/2020   HGB 11.5 05/02/2020   HCT 36.4 05/02/2020   MCV 81 05/02/2020   No results found for: "IRON", "TIBC", "FERRITIN"  Attestation Statements:   Reviewed by clinician on day of visit: allergies, medications, problem list, medical history, surgical history, family history, social history, and previous encounter notes.   Trude Mcburney, am acting as Energy manager for [employer], DO.  I have reviewed the above documentation for accuracy and completeness, and I agree with the above. Corinna Capra, DO

## 2022-02-26 ENCOUNTER — Encounter (INDEPENDENT_AMBULATORY_CARE_PROVIDER_SITE_OTHER): Payer: Self-pay | Admitting: Adult Health

## 2022-02-26 ENCOUNTER — Ambulatory Visit (INDEPENDENT_AMBULATORY_CARE_PROVIDER_SITE_OTHER): Payer: BC Managed Care – PPO | Admitting: Adult Health

## 2022-02-26 VITALS — BP 130/78 | HR 84 | Temp 98.1°F | Ht 65.0 in | Wt 260.0 lb

## 2022-02-26 DIAGNOSIS — Z6841 Body Mass Index (BMI) 40.0 and over, adult: Secondary | ICD-10-CM

## 2022-02-26 DIAGNOSIS — R632 Polyphagia: Secondary | ICD-10-CM | POA: Diagnosis not present

## 2022-02-26 DIAGNOSIS — E669 Obesity, unspecified: Secondary | ICD-10-CM

## 2022-02-26 DIAGNOSIS — E559 Vitamin D deficiency, unspecified: Secondary | ICD-10-CM | POA: Diagnosis not present

## 2022-02-26 DIAGNOSIS — R7303 Prediabetes: Secondary | ICD-10-CM | POA: Diagnosis not present

## 2022-02-26 MED ORDER — VITAMIN D (ERGOCALCIFEROL) 1.25 MG (50000 UNIT) PO CAPS: 50000.0000 [IU] | ORAL_CAPSULE | ORAL | 0 refills | Status: DC

## 2022-02-27 NOTE — Progress Notes (Signed)
Chief Complaint:   OBESITY Sara Stevenson is here to discuss her progress with her obesity treatment plan along with follow-up of her obesity related diagnoses. Sara Stevenson is on practicing portion control and making smarter food choices, such as increasing vegetables and decreasing simple carbohydrates and states she is following her eating plan approximately 30% of the time. Sara Stevenson states she is doing an at home workout 25-30 minutes 2 times per week.  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 This schedule makes it a challenge for her to meal plan and prep. Sara Stevenson is not married, and does not have children.   She reports boredom with category 2 meal plan.  Subjective:   1. Prediabetes Lab Results  Component Value Date   HGBA1C 5.8 (H) 08/28/2021   HGBA1C 6.2 (H) 03/06/2021   HGBA1C 6.0 (H) 10/10/2020   She is not on any antidiabetic medications.  2. Polyphagia PDMP reviewed, no aberrances noted.   Up 4 lbs since last office visit.   BP stable at OV. Qysymia 11.25/69 daily, via mail order.   Per patient Qysymia 11.25/69 QD "not really working."  3. Vitamin D deficiency Inconsistently taking Ergocalciferol.   Assessment/Plan:   1. Prediabetes Decrease sugar and carbohydrates and increase protein.   2. Polyphagia Wean off Qsymia.   3. Vitamin D deficiency Refill - Vitamin D, Ergocalciferol, (DRISDOL) 1.25 MG (50000 UNIT) CAPS capsule; Take 1 capsule (50,000 Units total) by mouth every 7 (seven) days.  Dispense: 4 capsule; Refill: 0  4. Obesity with current BMI of 43.4 Continue to journal.   Sara Stevenson is currently in the action stage of change. As such, her goal is to continue with weight loss  efforts. She has agreed to keeping a food journal and adhering to recommended goals of 1300 calories and 90 protein daily.   Exercise goals:  As is.   Behavioral modification strategies: increasing lean protein intake, decreasing simple carbohydrates, no skipping meals, meal planning and cooking strategies, keeping healthy foods in the home, and planning for success.  Sara Stevenson has agreed to follow-up with our clinic in 4 weeks. She was informed of the importance of frequent follow-up visits to maximize her success with intensive lifestyle modifications for her multiple health conditions.   Objective:   Blood pressure 130/78, pulse 84, temperature 98.1 F (36.7 C), height 5\' 5"  (1.651 m), weight 260 lb (117.9 kg), SpO2 97 %. Body mass index is 43.27 kg/m.  General: Cooperative, alert, well developed, in no acute distress. HEENT: Conjunctivae and lids unremarkable. Cardiovascular: Regular rhythm.  Lungs: Normal work of breathing. Neurologic: No focal deficits.   Lab Results  Component Value Date   CREATININE 1.08 (H) 08/28/2021   BUN 10 08/28/2021   NA 140 08/28/2021   K 4.5 08/28/2021   CL 108 (H) 08/28/2021   CO2 23 08/28/2021   Lab Results  Component Value Date   ALT 10 08/28/2021   AST 10 08/28/2021   ALKPHOS 106 08/28/2021   BILITOT <0.2 08/28/2021   Lab Results  Component Value Date   HGBA1C 5.8 (H) 08/28/2021   HGBA1C 6.2 (H) 03/06/2021   HGBA1C 6.0 (H) 10/10/2020   HGBA1C 6.0 (H) 05/02/2020   HGBA1C 6.1 (H)  03/27/2019   Lab Results  Component Value Date   INSULIN 21.0 08/28/2021   INSULIN 13.8 03/06/2021   INSULIN 26.7 (H) 10/10/2020   INSULIN 32.5 (H) 06/13/2020   Lab Results  Component Value Date   TSH 2.030 05/02/2020   Lab Results  Component Value Date   CHOL 133 05/02/2020   HDL 43 05/02/2020   LDLCALC 74 05/02/2020   TRIG 79 05/02/2020   CHOLHDL 3.1 05/02/2020   Lab Results  Component Value Date   VD25OH 31.2 08/28/2021   VD25OH 32.7  03/06/2021   VD25OH 28.6 (L) 10/10/2020   Lab Results  Component Value Date   WBC 9.0 05/02/2020   HGB 11.5 05/02/2020   HCT 36.4 05/02/2020   MCV 81 05/02/2020   No results found for: "IRON", "TIBC", "FERRITIN"  Attestation Statements:   Reviewed by clinician on day of visit: allergies, medications, problem list, medical history, surgical history, family history, social history, and previous encounter notes.  I, Davy Pique, RMA, am acting as Location manager for Mina Marble, NP.  I have reviewed the above documentation for accuracy and completeness, and I agree with the above. -  Valary Manahan d. Ferdinando Lodge, NP-C

## 2022-03-27 ENCOUNTER — Ambulatory Visit (INDEPENDENT_AMBULATORY_CARE_PROVIDER_SITE_OTHER): Payer: BC Managed Care – PPO | Admitting: Adult Health

## 2022-04-03 ENCOUNTER — Ambulatory Visit (INDEPENDENT_AMBULATORY_CARE_PROVIDER_SITE_OTHER): Payer: BC Managed Care – PPO | Admitting: Adult Health

## 2022-04-05 ENCOUNTER — Encounter (INDEPENDENT_AMBULATORY_CARE_PROVIDER_SITE_OTHER): Payer: Self-pay | Admitting: Family Medicine

## 2022-04-05 ENCOUNTER — Ambulatory Visit (INDEPENDENT_AMBULATORY_CARE_PROVIDER_SITE_OTHER): Payer: BC Managed Care – PPO | Admitting: Family Medicine

## 2022-04-05 VITALS — BP 130/81 | HR 73 | Temp 99.3°F | Ht 65.0 in | Wt 259.0 lb

## 2022-04-05 DIAGNOSIS — E669 Obesity, unspecified: Secondary | ICD-10-CM | POA: Diagnosis not present

## 2022-04-05 DIAGNOSIS — R7303 Prediabetes: Secondary | ICD-10-CM | POA: Diagnosis not present

## 2022-04-05 DIAGNOSIS — Z6841 Body Mass Index (BMI) 40.0 and over, adult: Secondary | ICD-10-CM | POA: Diagnosis not present

## 2022-04-05 DIAGNOSIS — E559 Vitamin D deficiency, unspecified: Secondary | ICD-10-CM | POA: Diagnosis not present

## 2022-04-05 MED ORDER — VITAMIN D (ERGOCALCIFEROL) 1.25 MG (50000 UNIT) PO CAPS: 50000.0000 [IU] | ORAL_CAPSULE | ORAL | 0 refills | Status: DC

## 2022-04-17 NOTE — Progress Notes (Signed)
Chief Complaint:   Sara Stevenson is here to discuss her progress with her obesity treatment plan along with follow-up of her obesity related diagnoses. Sara Stevenson is on keeping a food journal and adhering to recommended goals of 1300 calories and 90 grams of protein and states she is following her eating plan approximately 75% of the time. [name] states she is walking/doing You tube workouts 25-30 minutes 2 times per week.  Today's visit was #: 27 Starting weight: 289 lbs Starting date: 06/13/2020 Today's weight: 259 lbs Today's date: 04/05/2022 Total lbs lost to date: 30 lbs Total lbs lost since last in-office visit: 1  Interim History: [name] changed from Cat 2 to journaling at last office visit with [name]. 1st office visit with me, she has not been journaling at all and not on any particular plan. Skipping meals and also asking about Wegovy.  Subjective:   1. Vitamin D deficiency She is currently taking prescription vitamin D 50,000 IU each week. She denies nausea, vomiting or muscle weakness.  2. Prediabetes Sara Stevenson's A1c on 08/28/21 was 5.8 (prior 6.2). She was on Qsymia in the past from 2/22-9/17/23. She weighed 258 lbs 11/21/20 and now up.   Assessment/Plan:  No orders of the defined types were placed in this encounter.   Medications Discontinued During This Encounter  Medication Reason   Vitamin D, Ergocalciferol, (DRISDOL) 1.25 MG (50000 UNIT) CAPS capsule Reorder     Meds ordered this encounter  Medications   Vitamin D, Ergocalciferol, (DRISDOL) 1.25 MG (50000 UNIT) CAPS capsule    Sig: Take 1 capsule (50,000 Units total) by mouth every 7 (seven) days.    Dispense:  4 capsule    Refill:  0     1. Vitamin D deficiency We will refill ergocalciferol 50,000 IU once a week for 1 month with 0 refills.  -Refill Vitamin D, Ergocalciferol, (DRISDOL) 1.25 MG (50000 UNIT) CAPS capsule; Take 1 capsule (50,000 Units total) by mouth every 7 (seven) days.  Dispense: 4 capsule;  Refill: 0  2. Prediabetes Will consider obtaining labs in the future to have updated set and to see where her Pre DM and insulin levels are. Hopefully that will be motivation for change as well. Consider Metformin in the future. I informed her that we will not consider injectable at this time.   3. Obesity with current BMI of 43.1 [name] is currently in the action stage of change. As such, her goal is to continue with weight loss efforts. She has agreed to keeping a food journal and adhering to recommended goals of 1300-1400 calories and 90+ grams of protein daily.   Sara Stevenson's last RMR was 1873, on 06/13/2020. At future visit consider fasting blood work and repeat IC. She is encouraged to journal and bring in totals. Extensive discussion on to why that is a very beneficial activity.   Exercise goals: As is.  Behavioral modification strategies: keeping a strict food journal.  [name] has agreed to follow-up with our clinic in 3 weeks. She was informed of the importance of frequent follow-up visits to maximize her success with intensive lifestyle modifications for her multiple health conditions.   Objective:   Blood pressure 130/81, pulse 73, temperature 99.3 F (37.4 C), height 5\' 5"  (1.651 m), weight 259 lb (117.5 kg), SpO2 99 %. Body mass index is 43.1 kg/m.  General: Cooperative, alert, well developed, in no acute distress. HEENT: Conjunctivae and lids unremarkable. Cardiovascular: Regular rhythm.  Lungs: Normal work of breathing. Neurologic: No focal  deficits.   Lab Results  Component Value Date   CREATININE 1.08 (H) 08/28/2021   BUN 10 08/28/2021   NA 140 08/28/2021   K 4.5 08/28/2021   CL 108 (H) 08/28/2021   CO2 23 08/28/2021   Lab Results  Component Value Date   ALT 10 08/28/2021   AST 10 08/28/2021   ALKPHOS 106 08/28/2021   BILITOT <0.2 08/28/2021   Lab Results  Component Value Date   HGBA1C 5.8 (H) 08/28/2021   HGBA1C 6.2 (H) 03/06/2021   HGBA1C 6.0 (H)  10/10/2020   HGBA1C 6.0 (H) 05/02/2020   HGBA1C 6.1 (H) 03/27/2019   Lab Results  Component Value Date   INSULIN 21.0 08/28/2021   INSULIN 13.8 03/06/2021   INSULIN 26.7 (H) 10/10/2020   INSULIN 32.5 (H) 06/13/2020   Lab Results  Component Value Date   TSH 2.030 05/02/2020   Lab Results  Component Value Date   CHOL 133 05/02/2020   HDL 43 05/02/2020   LDLCALC 74 05/02/2020   TRIG 79 05/02/2020   CHOLHDL 3.1 05/02/2020   Lab Results  Component Value Date   VD25OH 31.2 08/28/2021   VD25OH 32.7 03/06/2021   VD25OH 28.6 (L) 10/10/2020   Lab Results  Component Value Date   WBC 9.0 05/02/2020   HGB 11.5 05/02/2020   HCT 36.4 05/02/2020   MCV 81 05/02/2020   No results found for: "IRON", "TIBC", "FERRITIN"  Attestation Statements:   Reviewed by clinician on day of visit: allergies, medications, problem list, medical history, surgical history, family history, social history, and previous encounter notes.  I, Brendell Tyus, am acting as Energy manager for [employer], DO.   I have reviewed the above documentation for accuracy and completeness, and I agree with the above. Carlye Grippe, D.O.  The 21st Century Cures Act was signed into law in 2016 which includes the topic of electronic health records.  This provides immediate access to information in MyChart.  This includes consultation notes, operative notes, office notes, lab results and pathology reports.  If you have any questions about what you read please let Sara Stevenson know at your next visit so we can discuss your concerns and take corrective action if need be.  We are right here with you.

## 2022-04-23 DIAGNOSIS — Z6841 Body Mass Index (BMI) 40.0 and over, adult: Secondary | ICD-10-CM | POA: Diagnosis not present

## 2022-04-23 DIAGNOSIS — N760 Acute vaginitis: Secondary | ICD-10-CM | POA: Diagnosis not present

## 2022-04-23 DIAGNOSIS — Z113 Encounter for screening for infections with a predominantly sexual mode of transmission: Secondary | ICD-10-CM | POA: Diagnosis not present

## 2022-04-23 DIAGNOSIS — Z1151 Encounter for screening for human papillomavirus (HPV): Secondary | ICD-10-CM | POA: Diagnosis not present

## 2022-04-23 DIAGNOSIS — Z124 Encounter for screening for malignant neoplasm of cervix: Secondary | ICD-10-CM | POA: Diagnosis not present

## 2022-04-23 DIAGNOSIS — Z01419 Encounter for gynecological examination (general) (routine) without abnormal findings: Secondary | ICD-10-CM | POA: Diagnosis not present

## 2022-05-02 ENCOUNTER — Ambulatory Visit (INDEPENDENT_AMBULATORY_CARE_PROVIDER_SITE_OTHER): Payer: BC Managed Care – PPO | Admitting: Family Medicine

## 2022-06-19 ENCOUNTER — Other Ambulatory Visit (INDEPENDENT_AMBULATORY_CARE_PROVIDER_SITE_OTHER): Payer: Self-pay | Admitting: Bariatrics

## 2022-06-19 DIAGNOSIS — Z3009 Encounter for other general counseling and advice on contraception: Secondary | ICD-10-CM

## 2022-07-10 ENCOUNTER — Encounter (INDEPENDENT_AMBULATORY_CARE_PROVIDER_SITE_OTHER): Payer: Self-pay | Admitting: Adult Health

## 2022-07-10 ENCOUNTER — Ambulatory Visit (INDEPENDENT_AMBULATORY_CARE_PROVIDER_SITE_OTHER): Payer: BC Managed Care – PPO | Admitting: Adult Health

## 2022-07-10 VITALS — BP 131/84 | HR 74 | Temp 97.6°F | Ht 65.0 in | Wt 268.0 lb

## 2022-07-10 DIAGNOSIS — Z6841 Body Mass Index (BMI) 40.0 and over, adult: Secondary | ICD-10-CM

## 2022-07-10 DIAGNOSIS — E538 Deficiency of other specified B group vitamins: Secondary | ICD-10-CM | POA: Diagnosis not present

## 2022-07-10 DIAGNOSIS — E559 Vitamin D deficiency, unspecified: Secondary | ICD-10-CM | POA: Diagnosis not present

## 2022-07-10 DIAGNOSIS — E669 Obesity, unspecified: Secondary | ICD-10-CM

## 2022-07-10 DIAGNOSIS — R7303 Prediabetes: Secondary | ICD-10-CM

## 2022-07-10 MED ORDER — VITAMIN D (ERGOCALCIFEROL) 1.25 MG (50000 UNIT) PO CAPS
50000.0000 [IU] | ORAL_CAPSULE | ORAL | 0 refills | Status: DC
Start: 2022-07-10 — End: 2022-08-06

## 2022-07-11 ENCOUNTER — Ambulatory Visit (INDEPENDENT_AMBULATORY_CARE_PROVIDER_SITE_OTHER): Payer: BC Managed Care – PPO | Admitting: Adult Health

## 2022-07-24 NOTE — Progress Notes (Unsigned)
Chief Complaint:   Sara Stevenson is here to discuss her progress with her obesity treatment plan along with follow-up of her obesity related diagnoses. Sara Stevenson is on keeping a food journal and adhering to recommended goals of 1300-1400 calories and 90+ protein and states she is following her eating plan approximately 0% of the time. Sara Stevenson states she is not exercising.   Today's visit was #: 28 Starting weight: 289 lbs Starting date: 06/13/2020 Today's weight: 268 lbs Today's date: 07/10/2022 Total lbs lost to date: 21 lbs Total lbs lost since last in-office visit: +9 lbs  Interim History: ***  Subjective:   1. Vitamin D deficiency 08/28/2021, Vitamin D level 31.2, below goal of 50-70.  2. Vitamin B 12 deficiency She has not been taking any oral B12 supplementation.  She currently endorses decreased ***  3. Prediabetes Patient is not currently on any blood glucose lowering medications.  No 1st degree family history of T2DM.  ***  Assessment/Plan:   1. Vitamin D deficiency Refill - Vitamin D, Ergocalciferol, (DRISDOL) 1.25 MG (50000 UNIT) CAPS capsule; Take 1 capsule (50,000 Units total) by mouth every 7 (seven) days.  Dispense: 4 capsule; Refill: 0  2. Vitamin B 12 deficiency Restart oral B12 supplement.  Check labs at next office visit.   3. Prediabetes Check labs at next office visit.   4. Obesity with current BMI of 44.6 Sara Stevenson is currently in the action stage of change. As such, her goal is to continue with weight loss efforts. She has agreed to keeping a food journal and adhering to recommended goals of 1300 calories and 90 protein.   Exercise goals:  2 times per week of 15 minutes of exercise.   Behavioral modification strategies: increasing lean protein intake, decreasing simple carbohydrates, meal planning and cooking strategies, keeping healthy foods in the home, and planning for success.  Sara Stevenson has agreed to follow-up with our clinic in 3 weeks. She was  informed of the importance of frequent follow-up visits to maximize her success with intensive lifestyle modifications for her multiple health conditions.   Objective:   Blood pressure 131/84, pulse 74, temperature 97.6 F (36.4 C), height 5' 5"$  (1.651 m), SpO2 100 %. Body mass index is 43.1 kg/m.  General: Cooperative, alert, well developed, in no acute distress. HEENT: Conjunctivae and lids unremarkable. Cardiovascular: Regular rhythm.  Lungs: Normal work of breathing. Neurologic: No focal deficits.   Lab Results  Component Value Date   CREATININE 1.08 (H) 08/28/2021   BUN 10 08/28/2021   NA 140 08/28/2021   K 4.5 08/28/2021   CL 108 (H) 08/28/2021   CO2 23 08/28/2021   Lab Results  Component Value Date   ALT 10 08/28/2021   AST 10 08/28/2021   ALKPHOS 106 08/28/2021   BILITOT <0.2 08/28/2021   Lab Results  Component Value Date   HGBA1C 5.8 (H) 08/28/2021   HGBA1C 6.2 (H) 03/06/2021   HGBA1C 6.0 (H) 10/10/2020   HGBA1C 6.0 (H) 05/02/2020   HGBA1C 6.1 (H) 03/27/2019   Lab Results  Component Value Date   INSULIN 21.0 08/28/2021   INSULIN 13.8 03/06/2021   INSULIN 26.7 (H) 10/10/2020   INSULIN 32.5 (H) 06/13/2020   Lab Results  Component Value Date   TSH 2.030 05/02/2020   Lab Results  Component Value Date   CHOL 133 05/02/2020   HDL 43 05/02/2020   LDLCALC 74 05/02/2020   TRIG 79 05/02/2020   CHOLHDL 3.1 05/02/2020   Lab Results  Component Value Date   VD25OH 31.2 08/28/2021   VD25OH 32.7 03/06/2021   VD25OH 28.6 (L) 10/10/2020   Lab Results  Component Value Date   WBC 9.0 05/02/2020   HGB 11.5 05/02/2020   HCT 36.4 05/02/2020   MCV 81 05/02/2020   No results found for: "IRON", "TIBC", "FERRITIN"  Attestation Statements:   Reviewed by clinician on day of visit: allergies, medications, problem list, medical history, surgical history, family history, social history, and previous encounter notes.  I, Davy Pique, RMA, am acting as  Location manager for Mina Marble, NP.  I have reviewed the above documentation for accuracy and completeness, and I agree with the above. -  ***

## 2022-08-06 ENCOUNTER — Ambulatory Visit (INDEPENDENT_AMBULATORY_CARE_PROVIDER_SITE_OTHER): Payer: BC Managed Care – PPO | Admitting: Adult Health

## 2022-08-06 ENCOUNTER — Encounter (INDEPENDENT_AMBULATORY_CARE_PROVIDER_SITE_OTHER): Payer: Self-pay | Admitting: Adult Health

## 2022-08-06 VITALS — BP 121/78 | HR 81 | Temp 98.2°F | Ht 65.0 in | Wt 267.0 lb

## 2022-08-06 DIAGNOSIS — E538 Deficiency of other specified B group vitamins: Secondary | ICD-10-CM

## 2022-08-06 DIAGNOSIS — R7989 Other specified abnormal findings of blood chemistry: Secondary | ICD-10-CM

## 2022-08-06 DIAGNOSIS — E559 Vitamin D deficiency, unspecified: Secondary | ICD-10-CM

## 2022-08-06 DIAGNOSIS — E669 Obesity, unspecified: Secondary | ICD-10-CM

## 2022-08-06 DIAGNOSIS — Z6841 Body Mass Index (BMI) 40.0 and over, adult: Secondary | ICD-10-CM

## 2022-08-06 DIAGNOSIS — R7303 Prediabetes: Secondary | ICD-10-CM

## 2022-08-06 MED ORDER — VITAMIN D (ERGOCALCIFEROL) 1.25 MG (50000 UNIT) PO CAPS: 50000.0000 [IU] | ORAL_CAPSULE | ORAL | 0 refills | Status: DC

## 2022-08-06 NOTE — Progress Notes (Signed)
Chief Complaint:   Sara Stevenson is here to discuss her progress with her obesity treatment plan along with follow-up of her obesity related diagnoses. Sara Stevenson is on keeping a food journal and adhering to recommended goals of 1300-1400 calories and 90 protein and states she is following her eating plan approximately 80% of the time.  Sara Stevenson states she is participating in YouTube exercise videos (apartment friendly) 15 minutes 2 times per week.  Today's visit was #: 3 Starting weight: 289 lbs Starting date: 06/13/2020 Today's weight: 267 lbs Today's date: 08/06/2022 Total lbs lost to date: 22 lbs Total lbs lost since last in-office visit: - 1lb  Interim History:  Reviewed Bioimpedance Results with pt: Muscle Mass + 1.2 lbs Adipose Mass - 2.2 lbs  She has been using YouTube guided exercise programs 71mn each,  2 x week!  She estimated to drink at least 60 oz water/day.  She has decreased days at her [employer] from 3 to 2 days a week.  Subjective:   1. Vitamin D deficiency  Latest Reference Range & Units 08/28/21 08:01  Vitamin D, 25-Hydroxy 30.0 - 100.0 ng/mL 31.2  She is taking weekly Ergocalciferol- denies N/V/Muscle Weakness  2. Vitamin B 12 deficiency  Latest Reference Range & Units 03/06/21 08:29  Vitamin B12 232 - 1,245 pg/mL 275  She is taking OTC B12 500 mcg QD, per bottle- should take TID. Reviewed OTC directions with pt.  3. Prediabetes Lab Results  Component Value Date   HGBA1C 5.8 (H) 08/28/2021   HGBA1C 6.2 (H) 03/06/2021   HGBA1C 6.0 (H) 10/10/2020   She has been off Qsymia since fall 2023. PDMP reivewed-she has not refilled Qsymia since 01/2022. She denies polyphagia. She is not currently on any antidiabetic mediation currently.  4. Elevated serum creatinine She denies know family hx of renal disease.  Latest Reference Range & Units 08/28/21 08:01  Creatinine 0.57 - 1.00 mg/dL 1.08 (H)  (H): Data is abnormally high  Assessment/Plan:    1. Vitamin D deficiency Check Labs Refilll - Vitamin D, Ergocalciferol, (DRISDOL) 1.25 MG (50000 UNIT) CAPS capsule; Take 1 capsule (50,000 Units total) by mouth every 7 (seven) days.  Dispense: 4 capsule; Refill: 0 - VITAMIN D 25 Hydroxy (Vit-D Deficiency, Fractures)  2. Vitamin B 12 deficiency Check labs Take OTC supplement as directed: TID - Vitamin B12  3. Prediabetes Remain off Qsymia- MAR updated. Check Labs - Hemoglobin A1c - Insulin, random  4. Elevated serum creatinine Check Labs Remain well hydrated Avoid Nephrotoxic substances - Comprehensive metabolic panel  5. Obesity with current BMI of 44.5  Sara Stevenson currently in the action stage of change. As such, her goal is to continue with weight loss efforts. She has agreed to keeping a food journal and adhering to recommended goals of 1300-1400 calories and 90 protein.   Exercise goals: For substantial health benefits, adults should do at least 150 minutes (2 hours and 30 minutes) a week of moderate-intensity, or 75 minutes (1 hour and 15 minutes) a week of vigorous-intensity aerobic physical activity, or an equivalent combination of moderate- and vigorous-intensity aerobic activity. Aerobic activity should be performed in episodes of at least 10 minutes, and preferably, it should be spread throughout the week.  Behavioral modification strategies: increasing lean protein intake, decreasing simple carbohydrates, increasing vegetables, increasing water intake, increasing high fiber foods, no skipping meals, meal planning and cooking strategies, keeping healthy foods in the home, better snacking choices, planning for success, and  keeping a strict food journal.  Sara Stevenson has agreed to follow-up with our clinic in 3 weeks. She was informed of the importance of frequent follow-up visits to maximize her success with intensive lifestyle modifications for her multiple health conditions.   Sara Stevenson was informed we would discuss her lab  results at her next visit unless there is a critical issue that needs to be addressed sooner. Sara Stevenson agreed to keep her next visit at the agreed upon time to discuss these results.  Objective:   Blood pressure 121/78, pulse 81, temperature 98.2 F (36.8 C), height '5\' 5"'$  (1.651 m), weight 267 lb (121.1 kg), SpO2 100 %. Body mass index is 44.43 kg/m.  General: Cooperative, alert, well developed, in no acute distress. HEENT: Conjunctivae and lids unremarkable. Cardiovascular: Regular rhythm.  Lungs: Normal work of breathing. Neurologic: No focal deficits.   Lab Results  Component Value Date   CREATININE 1.08 (H) 08/28/2021   BUN 10 08/28/2021   NA 140 08/28/2021   K 4.5 08/28/2021   CL 108 (H) 08/28/2021   CO2 23 08/28/2021   Lab Results  Component Value Date   ALT 10 08/28/2021   AST 10 08/28/2021   ALKPHOS 106 08/28/2021   BILITOT <0.2 08/28/2021   Lab Results  Component Value Date   HGBA1C 5.8 (H) 08/28/2021   HGBA1C 6.2 (H) 03/06/2021   HGBA1C 6.0 (H) 10/10/2020   HGBA1C 6.0 (H) 05/02/2020   HGBA1C 6.1 (H) 03/27/2019   Lab Results  Component Value Date   INSULIN 21.0 08/28/2021   INSULIN 13.8 03/06/2021   INSULIN 26.7 (H) 10/10/2020   INSULIN 32.5 (H) 06/13/2020   Lab Results  Component Value Date   TSH 2.030 05/02/2020   Lab Results  Component Value Date   CHOL 133 05/02/2020   HDL 43 05/02/2020   LDLCALC 74 05/02/2020   TRIG 79 05/02/2020   CHOLHDL 3.1 05/02/2020   Lab Results  Component Value Date   VD25OH 31.2 08/28/2021   VD25OH 32.7 03/06/2021   VD25OH 28.6 (L) 10/10/2020   Lab Results  Component Value Date   WBC 9.0 05/02/2020   HGB 11.5 05/02/2020   HCT 36.4 05/02/2020   MCV 81 05/02/2020   No results found for: "IRON", "TIBC", "FERRITIN"  Attestation Statements:   Reviewed by clinician on day of visit: allergies, medications, problem list, medical history, surgical history, family history, social history, and previous encounter  notes.  I have reviewed the above documentation for accuracy and completeness, and I agree with the above. -  Odel Schmid d. Paizlee Kinder, NP-C

## 2022-08-08 LAB — COMPREHENSIVE METABOLIC PANEL
ALT: 8 IU/L (ref 0–32)
AST: 13 IU/L (ref 0–40)
Albumin/Globulin Ratio: 1.3 (ref 1.2–2.2)
Albumin: 4 g/dL (ref 3.9–4.9)
Alkaline Phosphatase: 105 IU/L (ref 44–121)
BUN/Creatinine Ratio: 8 — ABNORMAL LOW (ref 9–23)
BUN: 8 mg/dL (ref 6–20)
Bilirubin Total: <0.2 mg/dL (ref 0.0–1.2)
CO2: 19 mmol/L — ABNORMAL LOW (ref 20–29)
Calcium: 9 mg/dL (ref 8.7–10.2)
Chloride: 107 mmol/L — ABNORMAL HIGH (ref 96–106)
Creatinine, Ser: 0.99 mg/dL (ref 0.57–1.00)
Globulin, Total: 3.1 g/dL (ref 1.5–4.5)
Glucose: 82 mg/dL (ref 70–99)
Potassium: 4.5 mmol/L (ref 3.5–5.2)
Sodium: 142 mmol/L (ref 134–144)
Total Protein: 7.1 g/dL (ref 6.0–8.5)
eGFR: 77 mL/min/{1.73_m2} (ref >59)

## 2022-08-08 LAB — VITAMIN D 25 HYDROXY (VIT D DEFICIENCY, FRACTURES): Vit D, 25-Hydroxy: 16.9 ng/mL — ABNORMAL LOW (ref 30.0–100.0)

## 2022-08-08 LAB — VITAMIN B12: Vitamin B-12: 381 pg/mL (ref 232–1245)

## 2022-08-28 ENCOUNTER — Encounter (INDEPENDENT_AMBULATORY_CARE_PROVIDER_SITE_OTHER): Payer: Self-pay | Admitting: Adult Health

## 2022-08-28 ENCOUNTER — Ambulatory Visit (INDEPENDENT_AMBULATORY_CARE_PROVIDER_SITE_OTHER): Payer: BC Managed Care – PPO | Admitting: Adult Health

## 2022-08-28 DIAGNOSIS — R7989 Other specified abnormal findings of blood chemistry: Secondary | ICD-10-CM | POA: Diagnosis not present

## 2022-08-28 DIAGNOSIS — E669 Obesity, unspecified: Secondary | ICD-10-CM

## 2022-08-28 DIAGNOSIS — E559 Vitamin D deficiency, unspecified: Secondary | ICD-10-CM

## 2022-08-28 DIAGNOSIS — R7303 Prediabetes: Secondary | ICD-10-CM | POA: Diagnosis not present

## 2022-08-28 DIAGNOSIS — Z6841 Body Mass Index (BMI) 40.0 and over, adult: Secondary | ICD-10-CM

## 2022-08-28 DIAGNOSIS — E538 Deficiency of other specified B group vitamins: Secondary | ICD-10-CM

## 2022-08-28 MED ORDER — VITAMIN D (ERGOCALCIFEROL) 1.25 MG (50000 UNIT) PO CAPS: 50000.0000 [IU] | ORAL_CAPSULE | ORAL | 0 refills | Status: DC

## 2022-08-28 NOTE — Progress Notes (Signed)
WEIGHT SUMMARY AND BIOMETRICS  Vitals Temp: 98.3 F (36.8 C) BP: 125/78 Pulse Rate: 78 SpO2: 100 %   Anthropometric Measurements Height: 5\' 5"  (1.651 m) Weight: 268 lb (121.6 kg) BMI (Calculated): 44.6 Weight at Last Visit: 267lb Weight Lost Since Last Visit: 0 Weight Gained Since Last Visit: 1lb Starting Weight: 289lb Total Weight Loss (lbs): 21 lb (9.526 kg)   Body Composition  Body Fat %: 45.4 % Fat Mass (lbs): 121.6 lbs Muscle Mass (lbs): 139 lbs Total Body Water (lbs): 92.4 lbs Visceral Fat Rating : 13   Other Clinical Data Fasting: yes Labs: no Today's Visit #: 30 Starting Date: 06/13/20    Chief Complaint:   OBESITY Sara Stevenson is here to discuss her progress with her obesity treatment plan. She is on the keeping a food journal and adhering to recommended goals of 1300-1400 calories and 90 protein and states she is following her eating plan approximately 60 % of the time. She states she is exercising Youtube Videos and Walking 20 minutes 2-3 times per week.   Interim History:  Sara Stevenson provided the following food recall typical of a WEEKday: Breakfast: homemade smoothie: spinach, almond milk, greek yogurt, honey, banana/strawberry packet, 1 scoop protein (unsure of cal/protein) Lunch: Grilled Chicken and spinach Dinner: Oatmeal and several strips of bacon  Weekends she will sleep in and often only eat one meal out.   Last Sat she ate at [facility]. Chicken, Buttered Corn Baked Potato= 770 cal   Weekday she estimates to consumed >1300 cal with at least 100g protein   She denies polyphagia.  Subjective:   1. Vitamin D deficiency Discussed Labs  Latest Reference Range & Units 08/06/22 14:16  Vitamin D, 25-Hydroxy 30.0 - 100.0 ng/mL 16.9 (L)  (L): Data is abnormally low  Subtherapeutic level despite weekly Ergocalciferol.   2. Prediabetes Discussed Labs  Latest Reference Range & Units 08/06/22 14:16  Glucose 70 - 99 mg/dL 82   Unable  to obtain A1c and Insulin levels- will complete at next lab draw She denies polyphagia BG at goal  3. Elevated serum creatinine Discussed Labs  Latest Reference Range & Units 08/06/22 14:16  Creatinine 0.57 - 1.00 mg/dL 0.99  IMPROVED from last level  4. Vitamin B 12 deficiency Discussed Labs  Latest Reference Range & Units 08/06/22 14:16  Vitamin B12 232 - 1,245 pg/mL 381  She is on OTC B12 supplementation TID  Assessment/Plan:   1. Vitamin D deficiency Refill and increase - Vitamin D, Ergocalciferol, (DRISDOL) 1.25 MG (50000 UNIT) CAPS capsule; Take 1 capsule (50,000 Units total) by mouth every 3 (three) days.  Dispense: 12 capsule; Refill: 0  2. Prediabetes Continue to limit sugar/CHo Continue regular exercise.  3. Elevated serum creatinine Continue to avoid Nephrotoxic substances. Increase water intake.  4. Vitamin B 12 deficiency Continue OTC Supplementation  5. Obesity, Starting BMI 48.09  Sara Stevenson is currently in the action stage of change. As such, her goal is to continue with weight loss efforts. She has agreed to keeping a food journal and adhering to recommended goals of 1300-1400 calories and 90 protein.   Handouts: Eating Out Guide  Exercise goals: For substantial health benefits, adults should do at least 150 minutes (2 hours and 30 minutes) a week of moderate-intensity, or 75 minutes (1 hour and 15 minutes) a week of vigorous-intensity aerobic physical activity, or an equivalent combination of moderate- and vigorous-intensity aerobic activity. Aerobic activity should be performed in episodes of at least 10  minutes, and preferably, it should be spread throughout the week.  Behavioral modification strategies: increasing lean protein intake, decreasing simple carbohydrates, increasing vegetables, increasing water intake, meal planning and cooking strategies, keeping healthy foods in the home, and planning for success.  Sara Stevenson has agreed to follow-up with our  clinic in 4 weeks. She was informed of the importance of frequent follow-up visits to maximize her success with intensive lifestyle modifications for her multiple health conditions.   Objective:   Blood pressure 125/78, pulse 78, temperature 98.3 F (36.8 C), height 5\' 5"  (1.651 m), weight 268 lb (121.6 kg), SpO2 100 %. Body mass index is 44.6 kg/m.  General: Cooperative, alert, well developed, in no acute distress. HEENT: Conjunctivae and lids unremarkable. Cardiovascular: Regular rhythm.  Lungs: Normal work of breathing. Neurologic: No focal deficits.   Lab Results  Component Value Date   CREATININE 0.99 08/06/2022   BUN 8 08/06/2022   NA 142 08/06/2022   K 4.5 08/06/2022   CL 107 (H) 08/06/2022   CO2 19 (L) 08/06/2022   Lab Results  Component Value Date   ALT 8 08/06/2022   AST 13 08/06/2022   ALKPHOS 105 08/06/2022   BILITOT <0.2 08/06/2022   Lab Results  Component Value Date   HGBA1C 5.8 (H) 08/28/2021   HGBA1C 6.2 (H) 03/06/2021   HGBA1C 6.0 (H) 10/10/2020   HGBA1C 6.0 (H) 05/02/2020   HGBA1C 6.1 (H) 03/27/2019   Lab Results  Component Value Date   INSULIN 21.0 08/28/2021   INSULIN 13.8 03/06/2021   INSULIN 26.7 (H) 10/10/2020   INSULIN 32.5 (H) 06/13/2020   Lab Results  Component Value Date   TSH 2.030 05/02/2020   Lab Results  Component Value Date   CHOL 133 05/02/2020   HDL 43 05/02/2020   LDLCALC 74 05/02/2020   TRIG 79 05/02/2020   CHOLHDL 3.1 05/02/2020   Lab Results  Component Value Date   VD25OH 16.9 (L) 08/06/2022   VD25OH 31.2 08/28/2021   VD25OH 32.7 03/06/2021   Lab Results  Component Value Date   WBC 9.0 05/02/2020   HGB 11.5 05/02/2020   HCT 36.4 05/02/2020   MCV 81 05/02/2020   No results found for: "IRON", "TIBC", "FERRITIN"  Attestation Statements:   Reviewed by clinician on day of visit: allergies, medications, problem list, medical history, surgical history, family history, social history, and previous encounter  notes.  I have reviewed the above documentation for accuracy and completeness, and I agree with the above. -  Trenda Corliss d. Elazar Argabright, NP-C

## 2022-09-27 ENCOUNTER — Other Ambulatory Visit (INDEPENDENT_AMBULATORY_CARE_PROVIDER_SITE_OTHER): Payer: Self-pay | Admitting: Adult Health

## 2022-09-27 ENCOUNTER — Encounter (INDEPENDENT_AMBULATORY_CARE_PROVIDER_SITE_OTHER): Payer: Self-pay | Admitting: Adult Health

## 2022-09-27 ENCOUNTER — Ambulatory Visit (INDEPENDENT_AMBULATORY_CARE_PROVIDER_SITE_OTHER): Payer: BC Managed Care – PPO | Admitting: Adult Health

## 2022-09-27 VITALS — BP 127/83 | HR 82 | Temp 98.2°F | Ht 65.0 in | Wt 268.0 lb

## 2022-09-27 DIAGNOSIS — R7303 Prediabetes: Secondary | ICD-10-CM | POA: Diagnosis not present

## 2022-09-27 DIAGNOSIS — E559 Vitamin D deficiency, unspecified: Secondary | ICD-10-CM | POA: Diagnosis not present

## 2022-09-27 DIAGNOSIS — Z6841 Body Mass Index (BMI) 40.0 and over, adult: Secondary | ICD-10-CM | POA: Diagnosis not present

## 2022-09-27 DIAGNOSIS — E669 Obesity, unspecified: Secondary | ICD-10-CM | POA: Diagnosis not present

## 2022-09-27 MED ORDER — METFORMIN HCL 500 MG PO TABS: ORAL_TABLET | ORAL | 0 refills | Status: DC

## 2022-09-27 MED ORDER — VITAMIN D (ERGOCALCIFEROL) 1.25 MG (50000 UNIT) PO CAPS: 50000.0000 [IU] | ORAL_CAPSULE | ORAL | 0 refills | Status: DC

## 2022-09-27 NOTE — Progress Notes (Addendum)
WEIGHT SUMMARY AND BIOMETRICS  Vitals Temp: 98.2 F (36.8 C) BP: 127/83 Pulse Rate: 82 SpO2: 100 %   Anthropometric Measurements Height: 5\' 5"  (1.651 m) Weight: 268 lb (121.6 kg) BMI (Calculated): 44.6 Weight at Last Visit: 268lb Weight Lost Since Last Visit: 0 Weight Gained Since Last Visit: 0 Starting Weight: 289lb Total Weight Loss (lbs): 21 lb (9.526 kg)   Body Composition  Body Fat %: 45.2 % Fat Mass (lbs): 121.4 lbs Muscle Mass (lbs): 140 lbs Total Body Water (lbs): 93.6 lbs Visceral Fat Rating : 13   Other Clinical Data Fasting: no Labs: no Today's Visit #: 31 Starting Date: 06/13/20    Chief Complaint:   OBESITY Sara Stevenson is here to discuss her progress with her obesity treatment plan. She is on the keeping a food journal and adhering to recommended goals of 1300-1400 calories and 90 protein and states she is following her eating plan approximately 70 % of the time. She states she is exercising walking 30 minutes 3-4 times per week.   Interim History:  With the improved weather, Sara Stevenson has greatly increased weekly walking. She will even get in several laps in between her two jobs!  She endorses increased energy levels with the increased Ergocalciferol dose to twice weekly from once weekly.  Subjective:   1. Vitamin D deficiency  Latest Reference Range & Units 08/06/22 14:16  Vitamin D, 25-Hydroxy 30.0 - 100.0 ng/mL 16.9 (L)  (L): Data is abnormally low 08/28/2022- Ergocalciferol was increased from weekly to bi-weekly due to  subtherapuoetic Vit D levels. Sara Stevenson endorses an increase in energy levels the last several weeks.  2. Prediabetes  Latest Reference Range & Units 08/28/21 08:01  Glucose 70 - 99 mg/dL 95  Hemoglobin R6E 4.8 - 5.6 % 5.8 (H)  Est. average glucose Bld gHb Est-mCnc mg/dL 454  INSULIN 2.6 - 09.8 uIU/mL 21.0  (H): Data is abnormally high  Latest Reference Range & Units 08/06/22 14:16  eGFR >59 mL/min/1.73 77  She  denies first degree family hx of diabetes. Discussed the risks/benefits of Metformin therapy, Sara Stevenson is agreeable to starting.   Assessment/Plan:   1. Vitamin D deficiency Refill - Vitamin D, Ergocalciferol, (DRISDOL) 1.25 MG (50000 UNIT) CAPS capsule; Take 1 capsule (50,000 Units total) by mouth every 3 (three) days.  Dispense: 12 capsule; Refill: 0  2. Prediabetes Metformin Handout provided to pt. Start Metformin 500mg - 1/2 tab daily for on week, then increase to 1 full tab daily Disp 30 RR 0  3. Obesity with current BMI of 44.6  Sara Stevenson is currently in the action stage of change. As such, her goal is to continue with weight loss efforts. She has agreed to keeping a food journal and adhering to recommended goals of 1330-1400 calories and 90 protein.   Exercise goals: For substantial health benefits, adults should do at least 150 minutes (2 hours and 30 minutes) a week of moderate-intensity, or 75 minutes (1 hour and 15 minutes) a week of vigorous-intensity aerobic physical activity, or an equivalent combination of moderate- and vigorous-intensity aerobic activity. Aerobic activity should be performed in episodes of at least 10 minutes, and preferably, it should be spread throughout the week.  Behavioral modification strategies: increasing lean protein intake, decreasing simple carbohydrates, increasing vegetables, increasing water intake, decreasing liquid calories, no skipping meals, meal planning and cooking strategies, keeping healthy foods in the home, better snacking choices, planning for success, and keeping a strict food journal.  Sara Stevenson has  agreed to follow-up with our clinic in 4 weeks. She was informed of the importance of frequent follow-up visits to maximize her success with intensive lifestyle modifications for her multiple health conditions.   Objective:   Blood pressure 127/83, pulse 82, temperature 98.2 F (36.8 C), height 5\' 5"  (1.651 m), weight 268 lb (121.6 kg),  SpO2 100 %. Body mass index is 44.6 kg/m.  General: Cooperative, alert, well developed, in no acute distress. HEENT: Conjunctivae and lids unremarkable. Cardiovascular: Regular rhythm.  Lungs: Normal work of breathing. Neurologic: No focal deficits.   Lab Results  Component Value Date   CREATININE 0.99 08/06/2022   BUN 8 08/06/2022   NA 142 08/06/2022   K 4.5 08/06/2022   CL 107 (H) 08/06/2022   CO2 19 (L) 08/06/2022   Lab Results  Component Value Date   ALT 8 08/06/2022   AST 13 08/06/2022   ALKPHOS 105 08/06/2022   BILITOT <0.2 08/06/2022   Lab Results  Component Value Date   HGBA1C 5.8 (H) 08/28/2021   HGBA1C 6.2 (H) 03/06/2021   HGBA1C 6.0 (H) 10/10/2020   HGBA1C 6.0 (H) 05/02/2020   HGBA1C 6.1 (H) 03/27/2019   Lab Results  Component Value Date   INSULIN 21.0 08/28/2021   INSULIN 13.8 03/06/2021   INSULIN 26.7 (H) 10/10/2020   INSULIN 32.5 (H) 06/13/2020   Lab Results  Component Value Date   TSH 2.030 05/02/2020   Lab Results  Component Value Date   CHOL 133 05/02/2020   HDL 43 05/02/2020   LDLCALC 74 05/02/2020   TRIG 79 05/02/2020   CHOLHDL 3.1 05/02/2020   Lab Results  Component Value Date   VD25OH 16.9 (L) 08/06/2022   VD25OH 31.2 08/28/2021   VD25OH 32.7 03/06/2021   Lab Results  Component Value Date   WBC 9.0 05/02/2020   HGB 11.5 05/02/2020   HCT 36.4 05/02/2020   MCV 81 05/02/2020   No results found for: "IRON", "TIBC", "FERRITIN"  Attestation Statements:   Reviewed by clinician on day of visit: allergies, medications, problem list, medical history, surgical history, family history, social history, and previous encounter notes.  I have reviewed the above documentation for accuracy and completeness, and I agree with the above. -  Sara Benda d. Reade Trefz, NP-C

## 2022-10-29 ENCOUNTER — Ambulatory Visit (INDEPENDENT_AMBULATORY_CARE_PROVIDER_SITE_OTHER): Payer: BC Managed Care – PPO | Admitting: Adult Health

## 2022-10-29 ENCOUNTER — Other Ambulatory Visit (INDEPENDENT_AMBULATORY_CARE_PROVIDER_SITE_OTHER): Payer: Self-pay | Admitting: Adult Health

## 2022-10-29 ENCOUNTER — Encounter (INDEPENDENT_AMBULATORY_CARE_PROVIDER_SITE_OTHER): Payer: Self-pay | Admitting: Adult Health

## 2022-10-29 VITALS — BP 128/82 | HR 80 | Temp 98.2°F | Ht 65.0 in | Wt 269.0 lb

## 2022-10-29 DIAGNOSIS — Z6841 Body Mass Index (BMI) 40.0 and over, adult: Secondary | ICD-10-CM | POA: Diagnosis not present

## 2022-10-29 DIAGNOSIS — R7303 Prediabetes: Secondary | ICD-10-CM | POA: Diagnosis not present

## 2022-10-29 DIAGNOSIS — E669 Obesity, unspecified: Secondary | ICD-10-CM | POA: Diagnosis not present

## 2022-10-29 DIAGNOSIS — E559 Vitamin D deficiency, unspecified: Secondary | ICD-10-CM | POA: Diagnosis not present

## 2022-10-29 MED ORDER — VITAMIN D (ERGOCALCIFEROL) 1.25 MG (50000 UNIT) PO CAPS: 50000.0000 [IU] | ORAL_CAPSULE | ORAL | 0 refills | Status: AC

## 2022-10-29 MED ORDER — METFORMIN HCL 500 MG PO TABS: ORAL_TABLET | ORAL | 0 refills | Status: AC

## 2022-10-29 NOTE — Progress Notes (Signed)
WEIGHT SUMMARY AND BIOMETRICS  Vitals Temp: 98.2 F (36.8 C) BP: 128/82 Pulse Rate: 80 SpO2: 99 %   Anthropometric Measurements Height: 5\' 5"  (1.651 m) Weight: 269 lb (122 kg) BMI (Calculated): 44.76 Weight at Last Visit: 268lb Weight Lost Since Last Visit: 0 Weight Gained Since Last Visit: 1lb Starting Weight: 289lb Total Weight Loss (lbs): 20 lb (9.072 kg)   Body Composition  Body Fat %: 45.8 % Fat Mass (lbs): 123.4 lbs Muscle Mass (lbs): 138.8 lbs Total Body Water (lbs): 92.2 lbs Visceral Fat Rating : 13   Other Clinical Data Fasting: no Labs: no Today's Visit #: 32 Starting Date: 06/13/20    Chief Complaint:   OBESITY Sara Stevenson is here to discuss her progress with her obesity treatment plan. She is on the keeping a food journal and adhering to recommended goals of 1300-1400 calories and 90 protein and states she is following her eating plan approximately 70-75 % of the time. She states she is exercising Walking 30 minutes 3-4 times per week.   Interim History:  Sara Stevenson started Metformin on/about 09/27/2022- titrated up to full 500mg  tab. She is also on twice weekly Ergocalciferol. Hunger/appetite-she continues to practice Intermittent Fasting (RF) Fasting from 7pm- 11am Exercise-she has been walking- often with her mother at a local track Hydration-she estimates to drink 30 oz water/day  Subjective:   1. Prediabetes Lab Results  Component Value Date   HGBA1C 5.8 (H) 08/28/2021   HGBA1C 6.2 (H) 03/06/2021   HGBA1C 6.0 (H) 10/10/2020   Sara Stevenson started Metformin on/about 09/27/2022- titrated up to full 500mg  tab. She reports frequent stools, denies hematochezia or abdominal pain.  2. Vitamin D deficiency  Latest Reference Range & Units 08/28/21 08:01 08/06/22 14:16  Vitamin D, 25-Hydroxy 30.0 - 100.0 ng/mL 31.2 16.9 (L)  (L): Data is abnormally low She is on twice weekly Ergocalciferol- she endorses an increase in energy levels. She denies  N/V/Muscle Weakness   Assessment/Plan:   1. Prediabetes Refill and increase  metFORMIN (GLUCOPHAGE) 500 MG tablet 1 tab twice daily with meals Dispense: 60 tablet, Refills: 0 ordered    2. Vitamin D deficiency Refill - Vitamin D, Ergocalciferol, (DRISDOL) 1.25 MG (50000 UNIT) CAPS capsule; Take 1 capsule (50,000 Units total) by mouth every 3 (three) days.  Dispense: 12 capsule; Refill: 0  3. Obesity with current BMI of 44.76  Sara Stevenson is currently in the action stage of change. As such, her goal is to continue with weight loss efforts. She has agreed to keeping a food journal and adhering to recommended goals of 1300-1400 calories and 90 protein.   Exercise goals: For substantial health benefits, adults should do at least 150 minutes (2 hours and 30 minutes) a week of moderate-intensity, or 75 minutes (1 hour and 15 minutes) a week of vigorous-intensity aerobic physical activity, or an equivalent combination of moderate- and vigorous-intensity aerobic activity. Aerobic activity should be performed in episodes of at least 10 minutes, and preferably, it should be spread throughout the week.  Behavioral modification strategies: increasing lean protein intake, decreasing simple carbohydrates, increasing vegetables, increasing water intake, no skipping meals, meal planning and cooking strategies, planning for success, and keeping a strict food journal.  Sara Stevenson has agreed to follow-up with our clinic in 4 weeks. She was informed of the importance of frequent follow-up visits to maximize her success with intensive lifestyle modifications for her multiple health conditions.   Check Fasting Labs at next OV.  She was provided information  for [facility] Surgery, re: Bariatric Surgery  Objective:   Blood pressure 128/82, pulse 80, temperature 98.2 F (36.8 C), height 5\' 5"  (1.651 m), weight 269 lb (122 kg), SpO2 99 %. Body mass index is 44.76 kg/m.  General: Cooperative, alert, well  developed, in no acute distress. HEENT: Conjunctivae and lids unremarkable. Cardiovascular: Regular rhythm.  Lungs: Normal work of breathing. Neurologic: No focal deficits.   Lab Results  Component Value Date   CREATININE 0.99 08/06/2022   BUN 8 08/06/2022   NA 142 08/06/2022   K 4.5 08/06/2022   CL 107 (H) 08/06/2022   CO2 19 (L) 08/06/2022   Lab Results  Component Value Date   ALT 8 08/06/2022   AST 13 08/06/2022   ALKPHOS 105 08/06/2022   BILITOT <0.2 08/06/2022   Lab Results  Component Value Date   HGBA1C 5.8 (H) 08/28/2021   HGBA1C 6.2 (H) 03/06/2021   HGBA1C 6.0 (H) 10/10/2020   HGBA1C 6.0 (H) 05/02/2020   HGBA1C 6.1 (H) 03/27/2019   Lab Results  Component Value Date   INSULIN 21.0 08/28/2021   INSULIN 13.8 03/06/2021   INSULIN 26.7 (H) 10/10/2020   INSULIN 32.5 (H) 06/13/2020   Lab Results  Component Value Date   TSH 2.030 05/02/2020   Lab Results  Component Value Date   CHOL 133 05/02/2020   HDL 43 05/02/2020   LDLCALC 74 05/02/2020   TRIG 79 05/02/2020   CHOLHDL 3.1 05/02/2020   Lab Results  Component Value Date   VD25OH 16.9 (L) 08/06/2022   VD25OH 31.2 08/28/2021   VD25OH 32.7 03/06/2021   Lab Results  Component Value Date   WBC 9.0 05/02/2020   HGB 11.5 05/02/2020   HCT 36.4 05/02/2020   MCV 81 05/02/2020   No results found for: "IRON", "TIBC", "FERRITIN"  Attestation Statements:   Reviewed by clinician on day of visit: allergies, medications, problem list, medical history, surgical history, family history, social history, and previous encounter notes.  I have reviewed the above documentation for accuracy and completeness, and I agree with the above. -  Onedia Vargus d. Jowan Skillin, NP-C

## 2022-12-17 ENCOUNTER — Ambulatory Visit (INDEPENDENT_AMBULATORY_CARE_PROVIDER_SITE_OTHER): Payer: BC Managed Care – PPO | Admitting: Adult Health

## 2022-12-18 ENCOUNTER — Other Ambulatory Visit (INDEPENDENT_AMBULATORY_CARE_PROVIDER_SITE_OTHER): Payer: Self-pay | Admitting: Adult Health

## 2022-12-18 DIAGNOSIS — E559 Vitamin D deficiency, unspecified: Secondary | ICD-10-CM

## 2023-02-26 DIAGNOSIS — M79652 Pain in left thigh: Secondary | ICD-10-CM | POA: Diagnosis not present

## 2023-04-29 DIAGNOSIS — Z6841 Body Mass Index (BMI) 40.0 and over, adult: Secondary | ICD-10-CM | POA: Diagnosis not present

## 2023-04-29 DIAGNOSIS — Z01419 Encounter for gynecological examination (general) (routine) without abnormal findings: Secondary | ICD-10-CM | POA: Diagnosis not present

## 2023-04-29 DIAGNOSIS — Z113 Encounter for screening for infections with a predominantly sexual mode of transmission: Secondary | ICD-10-CM | POA: Diagnosis not present

## 2023-06-27 DIAGNOSIS — Z0000 Encounter for general adult medical examination without abnormal findings: Secondary | ICD-10-CM | POA: Diagnosis not present

## 2023-06-27 DIAGNOSIS — Z6841 Body Mass Index (BMI) 40.0 and over, adult: Secondary | ICD-10-CM | POA: Diagnosis not present

## 2023-06-27 DIAGNOSIS — Z13220 Encounter for screening for lipoid disorders: Secondary | ICD-10-CM | POA: Diagnosis not present

## 2023-06-27 DIAGNOSIS — Z79899 Other long term (current) drug therapy: Secondary | ICD-10-CM | POA: Diagnosis not present

## 2023-06-27 DIAGNOSIS — R7303 Prediabetes: Secondary | ICD-10-CM | POA: Diagnosis not present

## 2023-06-27 DIAGNOSIS — Z7689 Persons encountering health services in other specified circumstances: Secondary | ICD-10-CM | POA: Diagnosis not present

## 2023-09-10 DIAGNOSIS — Z6841 Body Mass Index (BMI) 40.0 and over, adult: Secondary | ICD-10-CM | POA: Diagnosis not present

## 2023-09-10 DIAGNOSIS — E559 Vitamin D deficiency, unspecified: Secondary | ICD-10-CM | POA: Diagnosis not present

## 2023-09-10 DIAGNOSIS — R7303 Prediabetes: Secondary | ICD-10-CM | POA: Diagnosis not present

## 2023-11-04 DIAGNOSIS — B3731 Acute candidiasis of vulva and vagina: Secondary | ICD-10-CM | POA: Diagnosis not present

## 2023-12-16 DIAGNOSIS — Z113 Encounter for screening for infections with a predominantly sexual mode of transmission: Secondary | ICD-10-CM | POA: Diagnosis not present

## 2023-12-16 DIAGNOSIS — Z1159 Encounter for screening for other viral diseases: Secondary | ICD-10-CM | POA: Diagnosis not present

## 2023-12-16 DIAGNOSIS — R7303 Prediabetes: Secondary | ICD-10-CM | POA: Diagnosis not present

## 2023-12-16 DIAGNOSIS — Z6841 Body Mass Index (BMI) 40.0 and over, adult: Secondary | ICD-10-CM | POA: Diagnosis not present

## 2023-12-16 DIAGNOSIS — Z114 Encounter for screening for human immunodeficiency virus [HIV]: Secondary | ICD-10-CM | POA: Diagnosis not present

## 2023-12-16 DIAGNOSIS — E559 Vitamin D deficiency, unspecified: Secondary | ICD-10-CM | POA: Diagnosis not present

## 2024-02-24 DIAGNOSIS — B3731 Acute candidiasis of vulva and vagina: Secondary | ICD-10-CM | POA: Diagnosis not present

## 2024-04-08 DIAGNOSIS — E559 Vitamin D deficiency, unspecified: Secondary | ICD-10-CM | POA: Diagnosis not present

## 2024-04-08 DIAGNOSIS — R7303 Prediabetes: Secondary | ICD-10-CM | POA: Diagnosis not present

## 2024-04-08 DIAGNOSIS — Z6841 Body Mass Index (BMI) 40.0 and over, adult: Secondary | ICD-10-CM | POA: Diagnosis not present

## 2024-04-29 DIAGNOSIS — Z113 Encounter for screening for infections with a predominantly sexual mode of transmission: Secondary | ICD-10-CM | POA: Diagnosis not present

## 2024-04-29 DIAGNOSIS — N760 Acute vaginitis: Secondary | ICD-10-CM | POA: Diagnosis not present

## 2024-04-29 DIAGNOSIS — Z01419 Encounter for gynecological examination (general) (routine) without abnormal findings: Secondary | ICD-10-CM | POA: Diagnosis not present

## 2024-04-29 DIAGNOSIS — Z6841 Body Mass Index (BMI) 40.0 and over, adult: Secondary | ICD-10-CM | POA: Diagnosis not present

## 2024-05-22 ENCOUNTER — Ambulatory Visit
Admission: EM | Admit: 2024-05-22 | Discharge: 2024-05-22 | Disposition: A | Discharge status: Home / Self Care | Attending: Emergency Medicine | Admitting: Emergency Medicine

## 2024-05-22 DIAGNOSIS — Z113 Encounter for screening for infections with a predominantly sexual mode of transmission: Secondary | ICD-10-CM | POA: Diagnosis not present

## 2024-05-22 DIAGNOSIS — Z202 Contact with and (suspected) exposure to infections with a predominantly sexual mode of transmission: Secondary | ICD-10-CM | POA: Diagnosis not present

## 2024-05-22 MED ORDER — METRONIDAZOLE 500 MG PO TABS: 500.0000 mg | ORAL_TABLET | Freq: Two times a day (BID) | ORAL | 0 refills | Status: AC

## 2024-05-22 NOTE — Discharge Instructions (Addendum)
 Take the metronidazole as directed.  Do not have sexual activity while taking this medication and for an additional 7 days after completion.    Stop taking the metronidazole if you are test for trichomonas comes back negative.    Follow-up with your primary care provider.

## 2024-05-22 NOTE — ED Provider Notes (Signed)
 [facility]    CSN: [id number] Arrival date & time: 05/22/24  1234      History   Chief Complaint Chief Complaint  Patient presents with   SEXUALLY TRANSMITTED DISEASE    HPI Javonne Louissaint is a 33 y.o. female.  Patient presents with request for STD testing.  She states she was told today by her sexual partner that he tested positive for trichomonas.  Patient reports no symptoms.  She denies vaginal discharge, pelvic pain, dysuria, hematuria, abdominal pain, rash, lesions.  Patient reports STD testing in November which was negative.  The history is provided by the patient and medical records.    Past Medical History:  Diagnosis Date   Bacterial vaginitis    recurrrent   History of abnormal cervical Pap smear 2010; 03/20/13   POS HRHPV   Morbid obesity (HCC)    Obesity    Pre-diabetes    STD (sexually transmitted disease) 03/2012   chlamydia    Patient Active Problem List   Diagnosis Date Noted   Polyphagia 01/25/2022   Class 3 severe obesity with serious comorbidity and body mass index (BMI) of 45.0 to 49.9 in adult [facility]) 01/25/2022   Vitamin D  deficiency 06/14/2020   Prediabetes 03/08/2020   Morbid obesity (HCC) 02/03/2017   Contraceptive management 02/03/2017   At risk for diabetes mellitus 07/29/2015    Past Surgical History:  Procedure Laterality Date   COLPOSCOPY  05/04/2013   negative   WISDOM TOOTH EXTRACTION      OB History     Gravida  0   Para  0   Term  0   Preterm  0   AB  0   Living  0      SAB  0   IAB  0   Ectopic  0   Multiple  0   Live Births  0            Home Medications    Prior to Admission medications  Medication Sig Start Date End Date Taking? Authorizing Provider  metroNIDAZOLE (FLAGYL) 500 MG tablet Take 1 tablet (500 mg total) by mouth 2 (two) times daily. 05/22/24  Yes Corlis Burnard DEL, NP  semaglutide-weight management (WEGOVY) 1.7 MG/0.75ML SOAJ SQ injection Inject 1.7 mg into the skin.  03/26/24  Yes [provider]  cetirizine (ZYRTEC) 10 MG tablet Take 10 mg by mouth daily.    [provider]  desogestrel -ethinyl estradiol  (ISIBLOOM ) 0.15-30 MG-MCG tablet Take 1 tablet by mouth daily. 06/27/21 06/27/22  Delores Shields A, DO  metFORMIN  (GLUCOPHAGE ) 500 MG tablet 1 tab twice daily with meals Patient not taking: Reported on 05/22/2024 10/29/22   Jonel Pee D, NP  Vitamin D , Ergocalciferol , (DRISDOL ) 1.25 MG (50000 UNIT) CAPS capsule Take 1 capsule (50,000 Units total) by mouth every 3 (three) days. Patient not taking: Reported on 05/22/2024 10/29/22   Jonel Pee BIRCH, NP    Family History Family History  Problem Relation Age of Onset   High blood pressure Mother    Diabetes Other    Heart disease Neg Hx    Hypertension Neg Hx    Cancer Neg Hx     Social History Social History[1]   Allergies   Patient has no known allergies.   Review of Systems Review of Systems  Constitutional:  Negative for chills and fever.  Gastrointestinal:  Negative for abdominal pain.  Genitourinary:  Negative for dysuria, hematuria, pelvic pain and vaginal discharge.  Physical Exam Triage Vital Signs ED Triage Vitals  Encounter Vitals Group     BP 05/22/24 1319 139/82     Girls Systolic BP Percentile --      Girls Diastolic BP Percentile --      Boys Systolic BP Percentile --      Boys Diastolic BP Percentile --      Pulse Rate 05/22/24 1319 76     Resp 05/22/24 1319 18     Temp 05/22/24 1319 98 F (36.7 C)     Temp src --      SpO2 05/22/24 1319 99 %     Weight --      Height --      Head Circumference --      Peak Flow --      Pain Score 05/22/24 1327 0     Pain Loc --      Pain Education --      Exclude from Growth Chart --    No data found.  Updated Vital Signs BP 139/82   Pulse 76   Temp 98 F (36.7 C)   Resp 18   LMP 04/25/2024   SpO2 99%   Visual Acuity Right Eye Distance:   Left Eye Distance:   Bilateral Distance:    Right Eye  Near:   Left Eye Near:    Bilateral Near:     Physical Exam Constitutional:      General: She is not in acute distress. HENT:     Mouth/Throat:     Mouth: Mucous membranes are moist.  Cardiovascular:     Rate and Rhythm: Normal rate.  Pulmonary:     Effort: Pulmonary effort is normal. No respiratory distress.  Abdominal:     General: Bowel sounds are normal.     Palpations: Abdomen is soft.     Tenderness: There is no abdominal tenderness. There is no right CVA tenderness, left CVA tenderness, guarding or rebound.  Neurological:     Mental Status: She is alert.      UC Treatments / Results  Labs (all labs ordered are listed, but only abnormal results are displayed) Labs Reviewed  CERVICOVAGINAL ANCILLARY ONLY    EKG   Radiology No results found.  Procedures Procedures (including critical care time)  Medications Ordered in UC Medications - No data to display  Initial Impression / Assessment and Plan / UC Course  I have reviewed the triage vital signs and the nursing notes.  Pertinent labs & imaging results that were available during my care of the patient were reviewed by me and considered in my medical decision making (see chart for details).    Exposure to trichomonas, STD screening.  Afebrile and vital signs are stable.  Patient reports she received notification from a sexual partner that he tested positive for trichomonas.  She is currently asymptomatic.  Treating today with metronidazole and instructed patient to abstain from all sexual activity during treatment and for an additional 7 days.  Instructed her to stop the metronidazole if her test result is negative for trichomonas.  Education provided on trichomonas.  Discussed with patient that all test results will be available in her MyChart account and that we will call if treatment change is needed.  Instructed her to follow-up with her PCP.  She agrees to plan of care.  Final Clinical Impressions(s) / UC  Diagnoses   Final diagnoses:  Exposure to trichomonas  Screening for STD (sexually transmitted disease)  Discharge Instructions      Take the metronidazole as directed.  Do not have sexual activity while taking this medication and for an additional 7 days after completion.    Stop taking the metronidazole if you are test for trichomonas comes back negative.    Follow-up with your primary care provider.      ED Prescriptions     Medication Sig Dispense Auth. Provider   metroNIDAZOLE (FLAGYL) 500 MG tablet Take 1 tablet (500 mg total) by mouth 2 (two) times daily. 14 tablet Corlis Burnard DEL, NP      PDMP not reviewed this encounter.    [1]  Social History Tobacco Use   Smoking status: Never   Smokeless tobacco: Never  Vaping Use   Vaping status: Never Used  Substance Use Topics   Alcohol use: Yes    Comment: occasionally   Drug use: No     Corlis Burnard DEL, NP 05/22/24 1402

## 2024-05-22 NOTE — ED Triage Notes (Signed)
 Patient to Urgent Care for STD testing- reports her partner informed her this morning that they tested positive for trich.  Denies any symptoms.

## 2024-05-25 LAB — CERVICOVAGINAL ANCILLARY ONLY
Chlamydia: NEGATIVE
Comment: NEGATIVE
Comment: NEGATIVE
Comment: NORMAL
Neisseria Gonorrhea: NEGATIVE
Trichomonas: NEGATIVE

## 2024-06-08 DIAGNOSIS — N760 Acute vaginitis: Secondary | ICD-10-CM | POA: Diagnosis not present
